# Patient Record
Sex: Female | Born: 1950 | Race: Black or African American | Hispanic: No | Smoking: Never smoker
Health system: Southern US, Community
[De-identification: ages and names within clinical notes are randomized; demographics above are authoritative.]

## PROBLEM LIST (undated history)

## (undated) DIAGNOSIS — R0602 Shortness of breath: Secondary | ICD-10-CM

## (undated) DIAGNOSIS — Z9889 Other specified postprocedural states: Secondary | ICD-10-CM

## (undated) DIAGNOSIS — E119 Type 2 diabetes mellitus without complications: Secondary | ICD-10-CM

## (undated) DIAGNOSIS — I10 Essential (primary) hypertension: Secondary | ICD-10-CM

## (undated) DIAGNOSIS — R112 Nausea with vomiting, unspecified: Secondary | ICD-10-CM

## (undated) DIAGNOSIS — E0590 Thyrotoxicosis, unspecified without thyrotoxic crisis or storm: Secondary | ICD-10-CM

## (undated) HISTORY — PX: EYE SURGERY: SHX253

---

## 2000-10-05 ENCOUNTER — Other Ambulatory Visit: Admission: RE | Admit: 2000-10-05 | Discharge: 2000-10-05 | Payer: Self-pay | Admitting: Family Medicine

## 2000-11-21 ENCOUNTER — Emergency Department (HOSPITAL_COMMUNITY): Admission: EM | Admit: 2000-11-21 | Discharge: 2000-11-21 | Payer: Self-pay | Admitting: Emergency Medicine

## 2002-03-25 ENCOUNTER — Emergency Department (HOSPITAL_COMMUNITY): Admission: EM | Admit: 2002-03-25 | Discharge: 2002-03-25 | Payer: Self-pay | Admitting: *Deleted

## 2002-03-25 ENCOUNTER — Encounter: Payer: Self-pay | Admitting: *Deleted

## 2002-03-28 ENCOUNTER — Emergency Department (HOSPITAL_COMMUNITY): Admission: EM | Admit: 2002-03-28 | Discharge: 2002-03-28 | Payer: Self-pay | Admitting: Emergency Medicine

## 2003-11-28 ENCOUNTER — Emergency Department (HOSPITAL_COMMUNITY): Admission: EM | Admit: 2003-11-28 | Discharge: 2003-11-29 | Payer: Self-pay | Admitting: *Deleted

## 2003-12-07 ENCOUNTER — Ambulatory Visit (HOSPITAL_COMMUNITY): Admission: RE | Admit: 2003-12-07 | Discharge: 2003-12-07 | Payer: Self-pay | Admitting: *Deleted

## 2004-05-08 ENCOUNTER — Emergency Department (HOSPITAL_COMMUNITY): Admission: EM | Admit: 2004-05-08 | Discharge: 2004-05-08 | Payer: Self-pay | Admitting: *Deleted

## 2006-03-08 IMAGING — CT CT CHEST W/ CM
1 of 2 series · 14 of 30 positions shown, 18 images · IV contrast (CONTRAST)
Comparison: none

CLINICAL DATA: Bronchitis; cough. Follow-up from chest x-ray of 11/29/03 which showed a vague
density in the right lower lobe.

[Series 7694: — · axial · 0.59mm/px · z∈[+1550,+1820]mm · 14 of 64 slices shown, 18 images]
[im 5/64  mediastinal]
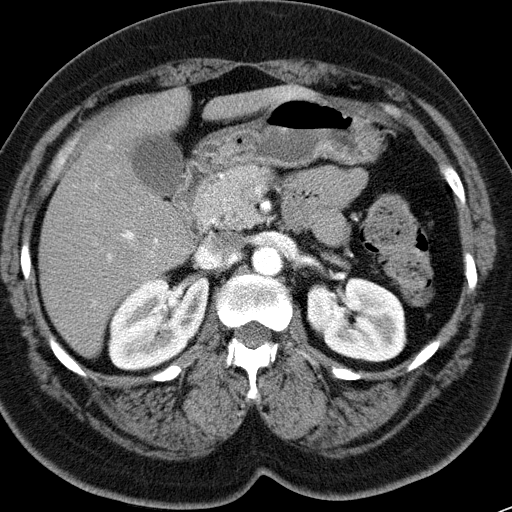
[im 5/64  lung]
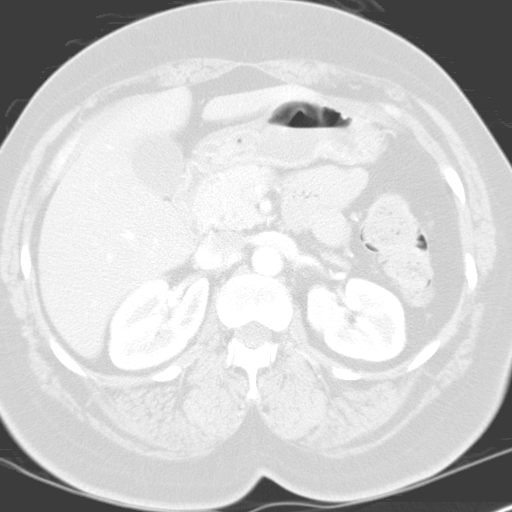
[im 10/64  lung]
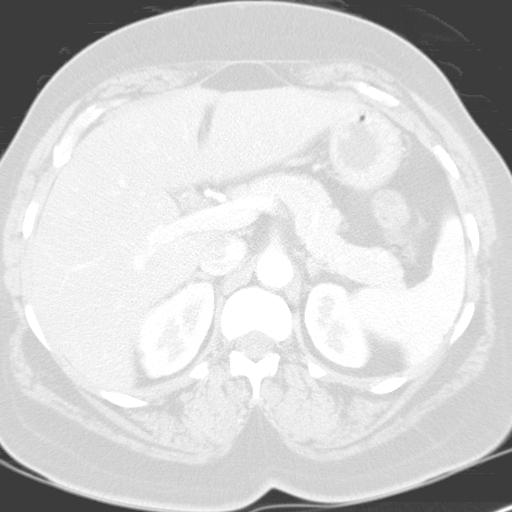
[im 14/64  lung]
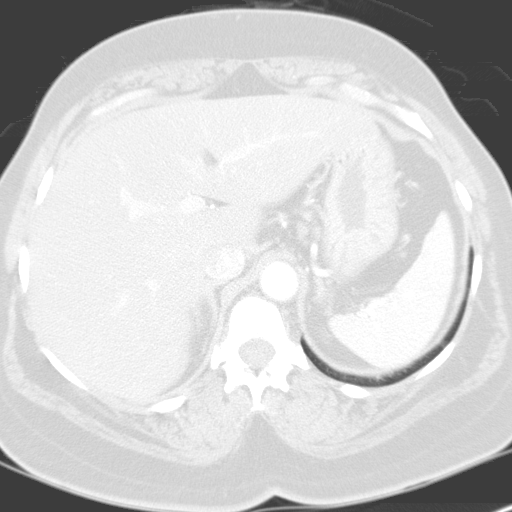
[im 19/64  lung]
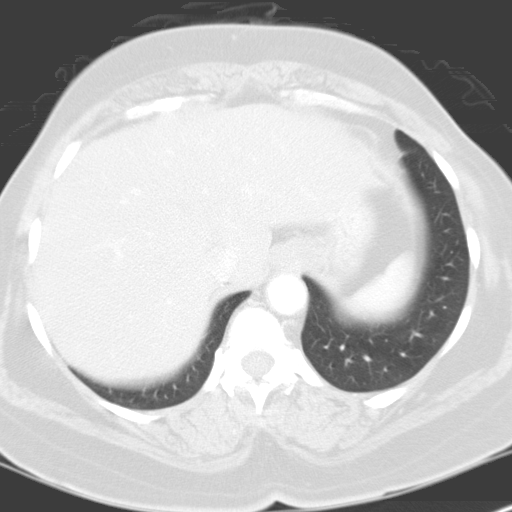
[im 23/64  mediastinal]
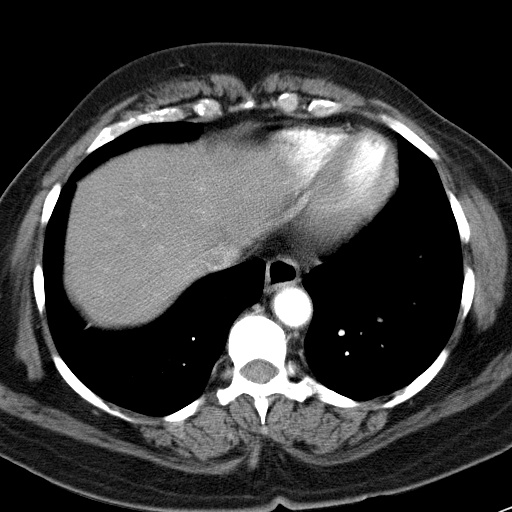
[im 23/64  lung]
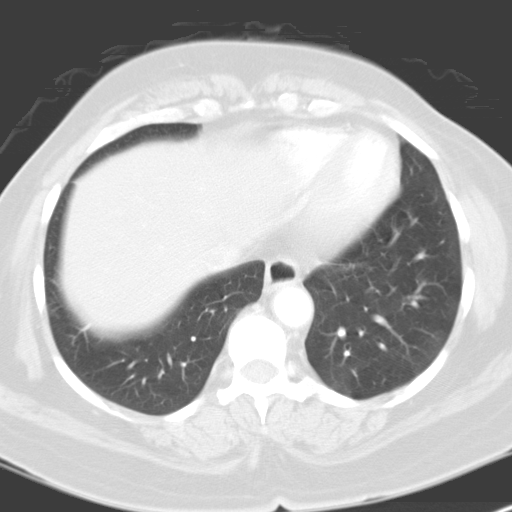
[im 28/64  lung]
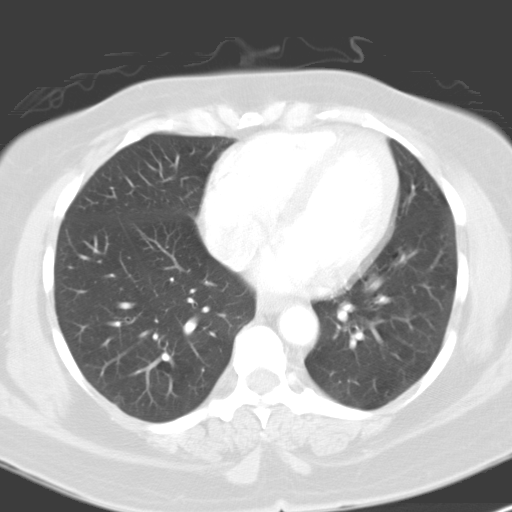
[im 31/64  lung]
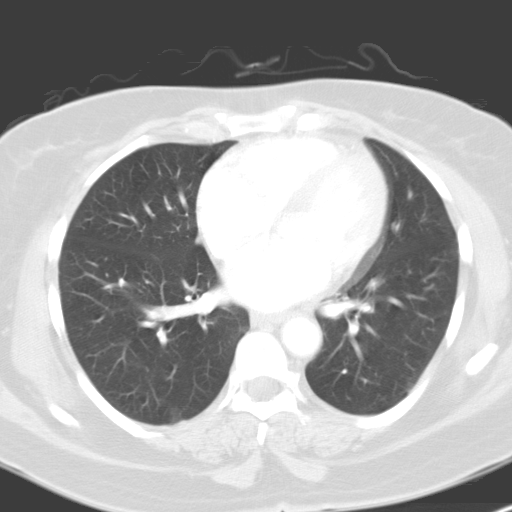
[im 32/64  lung]
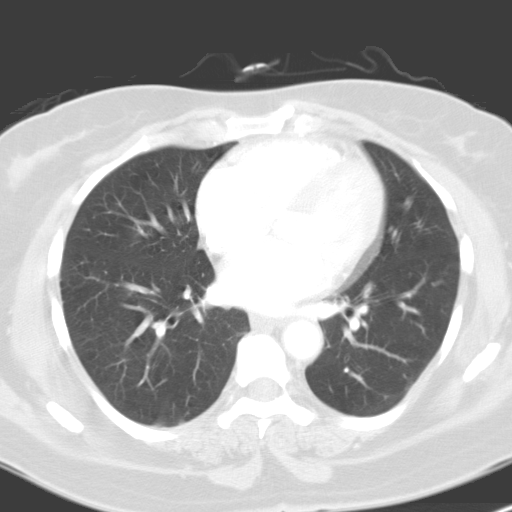
[im 37/64  mediastinal]
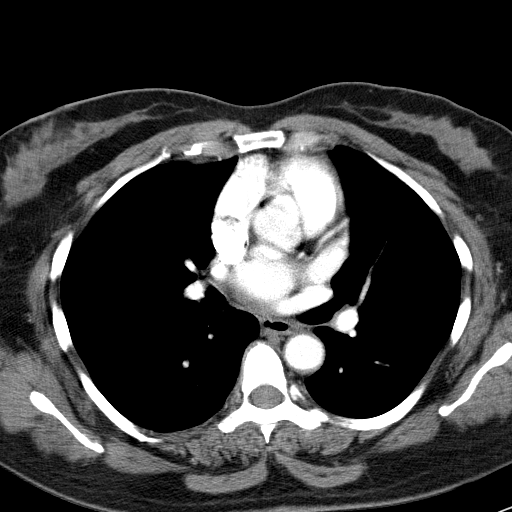
[im 37/64  lung]
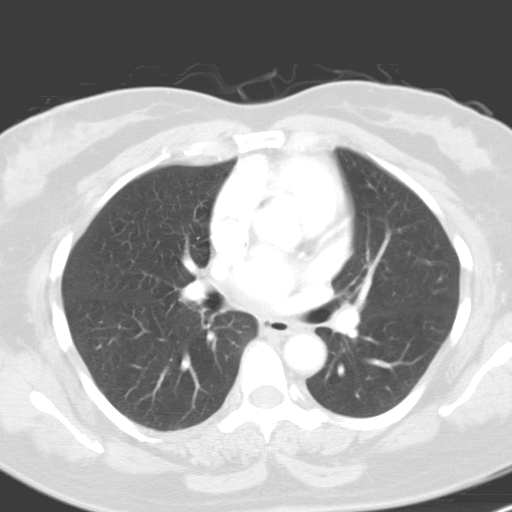
[im 41/64  lung]
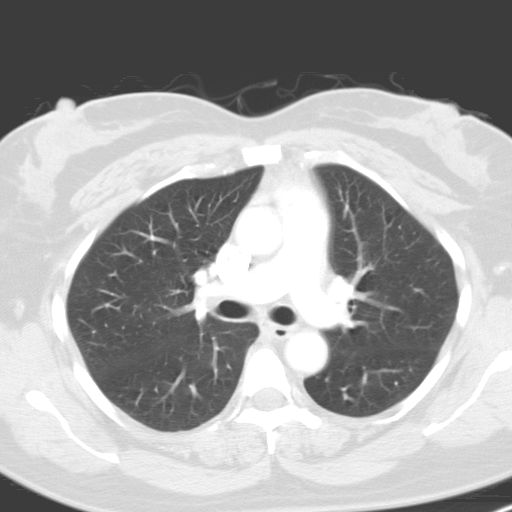
[im 46/64  lung]
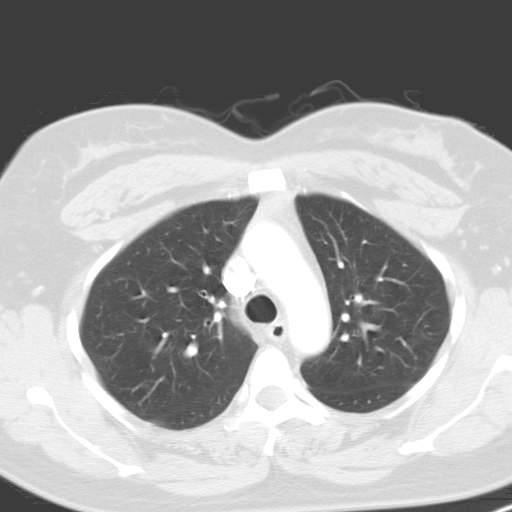
[im 50/64  lung]
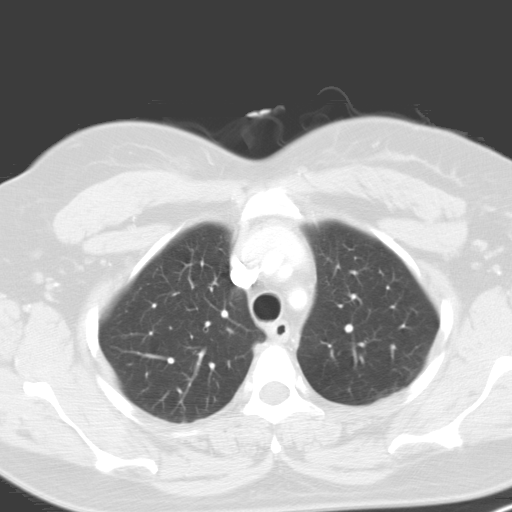
[im 55/64  mediastinal]
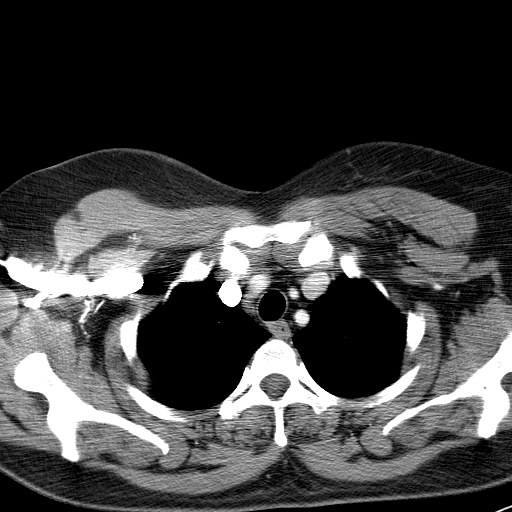
[im 55/64  lung]
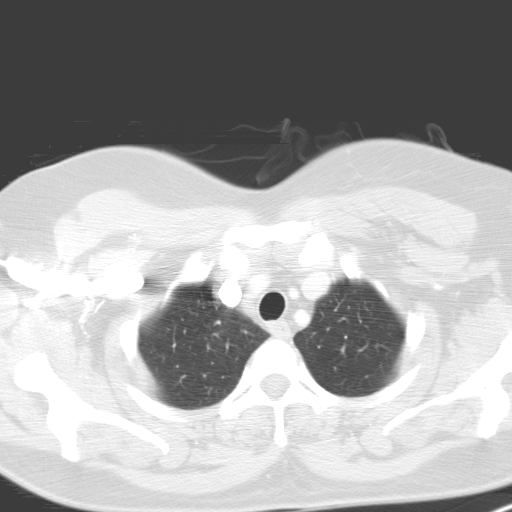
[im 59/64  lung]
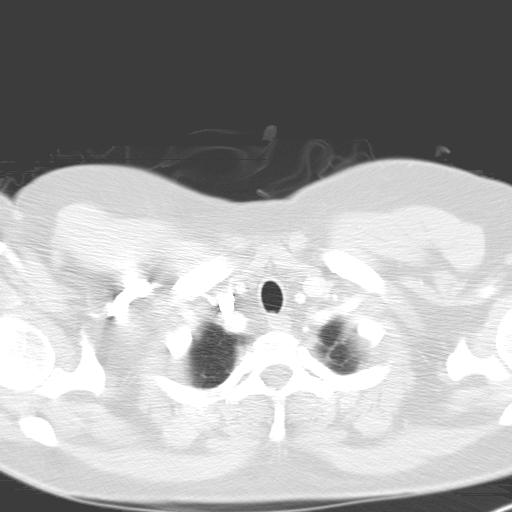

[14 of 30 positions shown; findings below may reference images not displayed]

CT SCAN OF THE CHEST WITH CONTRAST

After the intravenous injection of 100 ml of [id number], a series of scans of the entire chest
were made and are compared to the previous chest x-ray and show mild generalized peribronchial
thickening.  The area noted on the chest x-ray shows no evidence of mass or abnormality on the CT
scan. There is however a somewhat thickened bronchus associated with the anterior aspect of the
right upper lobe best seen on image #21 which could be an area of mild bronchiectasis of the
cylindrical type. No mass or obstruction is seen within the bronchial tree or the trachea.  There
is no consolidation, pleural effusion, or pneumothorax.  The heart and mediastinum are normal.

Bony thorax is normal.  No abnormal lymph nodes are identified.  There may be a small hiatal
hernia. The portion of the liver that is seen appears normal.  The adrenal glands appear normal.

IMPRESSION 

Generalized peribronchial thickening consistent with bronchitis.

Area of very focal questionable early cylindrical bronchiectasis right upper lobe. 

No evidence of neoplasm is seen.

## 2010-08-05 ENCOUNTER — Other Ambulatory Visit (HOSPITAL_COMMUNITY): Payer: Self-pay | Admitting: Family Medicine

## 2010-08-05 DIAGNOSIS — Z139 Encounter for screening, unspecified: Secondary | ICD-10-CM

## 2010-08-14 ENCOUNTER — Ambulatory Visit (HOSPITAL_COMMUNITY): Payer: Self-pay

## 2010-10-20 ENCOUNTER — Other Ambulatory Visit (HOSPITAL_COMMUNITY)
Admission: RE | Admit: 2010-10-20 | Discharge: 2010-10-20 | Disposition: A | Discharge status: Home / Self Care | Payer: PRIVATE HEALTH INSURANCE | Source: Ambulatory Visit | Attending: Obstetrics and Gynecology | Admitting: Obstetrics and Gynecology

## 2010-10-20 DIAGNOSIS — Z01419 Encounter for gynecological examination (general) (routine) without abnormal findings: Secondary | ICD-10-CM | POA: Insufficient documentation

## 2010-10-20 DIAGNOSIS — Z113 Encounter for screening for infections with a predominantly sexual mode of transmission: Secondary | ICD-10-CM | POA: Insufficient documentation

## 2010-11-04 ENCOUNTER — Other Ambulatory Visit: Payer: Self-pay

## 2010-11-04 ENCOUNTER — Encounter (HOSPITAL_COMMUNITY)
Admission: RE | Admit: 2010-11-04 | Discharge: 2010-11-04 | Disposition: A | Discharge status: Home / Self Care | Payer: PRIVATE HEALTH INSURANCE | Source: Ambulatory Visit | Attending: Obstetrics and Gynecology | Admitting: Obstetrics and Gynecology

## 2010-11-04 ENCOUNTER — Encounter (HOSPITAL_COMMUNITY): Payer: Self-pay

## 2010-11-04 HISTORY — DX: Other specified postprocedural states: Z98.890

## 2010-11-04 HISTORY — DX: Thyrotoxicosis, unspecified without thyrotoxic crisis or storm: E05.90

## 2010-11-04 HISTORY — DX: Other specified postprocedural states: R11.2

## 2010-11-04 HISTORY — DX: Essential (primary) hypertension: I10

## 2010-11-04 HISTORY — DX: Shortness of breath: R06.02

## 2010-11-04 LAB — URINALYSIS, ROUTINE W REFLEX MICROSCOPIC
Bilirubin Urine: NEGATIVE
Glucose, UA: NEGATIVE mg/dL
Hgb urine dipstick: NEGATIVE
Ketones, ur: NEGATIVE mg/dL
Leukocytes, UA: NEGATIVE
Nitrite: NEGATIVE
Protein, ur: 30 mg/dL — AB
Urobilinogen, UA: 0.2 mg/dL (ref 0.0–1.0)
pH: 6 (ref 5.0–8.0)

## 2010-11-04 LAB — BASIC METABOLIC PANEL
CO2: 29 mEq/L (ref 19–32)
Calcium: 10.1 mg/dL (ref 8.4–10.5)
Chloride: 104 mEq/L (ref 96–112)
Creatinine, Ser: 0.81 mg/dL (ref 0.50–1.10)
GFR calc Af Amer: >60 mL/min (ref >60)
Glucose, Bld: 147 mg/dL — ABNORMAL HIGH (ref 70–99)
Potassium: 4.3 mEq/L (ref 3.5–5.1)
Sodium: 139 mEq/L (ref 135–145)

## 2010-11-04 LAB — URINE MICROSCOPIC-ADD ON

## 2010-11-04 LAB — CBC
HCT: 35.3 % — ABNORMAL LOW (ref 36.0–46.0)
Hemoglobin: 11.6 g/dL — ABNORMAL LOW (ref 12.0–15.0)
MCH: 28 pg (ref 26.0–34.0)
MCHC: 32.9 g/dL (ref 30.0–36.0)
MCV: 85.1 fL (ref 78.0–100.0)
Platelets: 305 10*3/uL (ref 150–400)
RBC: 4.15 MIL/uL (ref 3.87–5.11)
RDW: 14.1 % (ref 11.5–15.5)
WBC: 7 10*3/uL (ref 4.0–10.5)

## 2010-11-04 NOTE — Patient Instructions (Addendum)
22 Leslie Lindsey  11/04/2010   Your procedure is scheduled on:  11/11/2010  Report to [facility] at  830  AM.  Call this number if you have problems the morning of surgery: [phone]   Remember:   Do not eat food:After Midnight.  Do not drink clear liquids: After Midnight.  Take these medicines the morning of surgery with A SIP OF WATER: Vasotec, hctz,synthroid,metoprolol   Do not wear jewelry, make-up or nail polish.  Do not wear lotions, powders, or perfumes. You may wear deodorant.  Do not shave 48 hours prior to surgery.  Do not bring valuables to the hospital.  Contacts, dentures or bridgework may not be worn into surgery.  Leave suitcase in the car. After surgery it may be brought to your room.  For patients admitted to the hospital, checkout time is 11:00 AM the day of discharge.   Patients discharged the day of surgery will not be allowed to drive home.  Name and phone number of your driver: family  Special Instructions: CHG Shower Use Special Wash: 1/2 bottle night before surgery and 1/2 bottle morning of surgery.   Please read over the following fact sheets that you were given: Pain Booklet, MRSA Information, Surgical Site Infection Prevention, Anesthesia Post-op Instructions and Care and Recovery After Surgery PATIENT INSTRUCTIONS

## 2010-11-10 ENCOUNTER — Other Ambulatory Visit: Payer: Self-pay | Admitting: Obstetrics and Gynecology

## 2010-11-10 NOTE — H&P (Signed)
Leslie Lindsey is an 60 y.o. female who is admitted thru day surgery for Hysteroscopy, dilation and curettage, with probable residual endometrial polyp. She had removal of a prolapsed endometrial polyp in the office setting, after referral by Dr Steven Knowlton, but subsequent ultrasound shows residual endometrial thickening. Portion of polyp removed showed simply hyperplasia without atypia.  Residual polyp is suspected. Pertinent Gynecological History: Menses: post-menopausal Bleeding: post menopausal bleeding Contraception: post menopausal status DES exposure: denies Blood transfusions: none Sexually transmitted diseases: no past history Previous GYN Procedures: office polyp removal  Last mammogram: managed by dr Knowlton Date:  Last pap: normal Date: Dr Knowlton OB History: G3, P2012   Menstrual History: Menarche age:  No LMP recorded. Patient is postmenopausal.    Past Medical History  Diagnosis Date  . PONV (postoperative nausea and vomiting)   . Hyperthyroidism   . Hypertension   . Shortness of breath   . Asthma     Past Surgical History  Procedure Date  . Eye surgery     30 years ago    Family History  Problem Relation Age of Onset  . Anesthesia problems Neg Hx   . Hypotension Neg Hx   . Malignant hyperthermia Neg Hx   . Pseudochol deficiency Neg Hx     Social History:  reports that she has never smoked. She does not have any smokeless tobacco history on file. She reports that she does not drink alcohol or use illicit drugs.  Allergies:  Allergies  Allergen Reactions  . Peanut-Containing Drug Products Anaphylaxis     (Not in a hospital admission)  ROSReview of Systems - Negative except peanut allergy   There were no vitals taken for this visit. Physical ExamPhysical Examination: General appearance - alert, well appearing, and in no distress and oriented to person, place, and time Mental status - alert, oriented to person, place, and time Neck -  supple, no significant adenopathy Chest - clear to auscultation, no wheezes, rales or rhonchi, symmetric air entry Heart - normal rate and regular rhythm Abdomen - soft, nontender, nondistended, no masses or organomegaly Pelvic - VULVA: normal appearing vulva with no masses, tenderness or lesions, VAGINA: normal appearing vagina with normal color and discharge, no lesions, atrophic, CERVIX: normal appearing cervix without discharge or lesions, endocervical polyp size 1.5  Cm was removed in office, UTERUS: uterus is normal size, shape, consistency and nontender, ultrasound shows a thickened endometrium 1.2 cm diameter, with a cystic area in the endometrial thickening, ADNEXA: normal adnexa in size, nontender and no masses Extremities - peripheral pulses normal, no pedal edema, no clubbing or cyanosis, Homan's sign negative bilaterally CBC    Component Value Date/Time   WBC 7.0 11/04/2010 0900   RBC 4.15 11/04/2010 0900   HGB 11.6* 11/04/2010 0900   HCT 35.3* 11/04/2010 0900   PLT 305 11/04/2010 0900   MCV 85.1 11/04/2010 0900   MCH 28.0 11/04/2010 0900   MCHC 32.9 11/04/2010 0900   RDW 14.1 11/04/2010 0900       Assessment/Plan: Postmenopausal Bleeding, endometrial polyp, for hysteroscopy, dilation and curettage, with removal of polyp remnant.   Zerek Litsey V 11/10/2010, 9:44 PM   

## 2010-11-11 ENCOUNTER — Encounter (HOSPITAL_COMMUNITY): Payer: Self-pay

## 2010-11-11 ENCOUNTER — Ambulatory Visit (HOSPITAL_COMMUNITY)
Admission: RE | Admit: 2010-11-11 | Discharge: 2010-11-11 | Disposition: A | Discharge status: Home / Self Care | Payer: PRIVATE HEALTH INSURANCE | Source: Ambulatory Visit | Attending: Obstetrics and Gynecology | Admitting: Obstetrics and Gynecology

## 2010-11-11 ENCOUNTER — Ambulatory Visit (HOSPITAL_COMMUNITY): Payer: PRIVATE HEALTH INSURANCE | Admitting: Anesthesiology

## 2010-11-11 ENCOUNTER — Encounter (HOSPITAL_COMMUNITY): Payer: Self-pay | Admitting: Anesthesiology

## 2010-11-11 ENCOUNTER — Encounter (HOSPITAL_COMMUNITY)
Admission: RE | Disposition: A | Discharge status: Home / Self Care | Payer: Self-pay | Source: Ambulatory Visit | Attending: Obstetrics and Gynecology

## 2010-11-11 ENCOUNTER — Other Ambulatory Visit: Payer: Self-pay | Admitting: Obstetrics and Gynecology

## 2010-11-11 DIAGNOSIS — Z01812 Encounter for preprocedural laboratory examination: Secondary | ICD-10-CM | POA: Insufficient documentation

## 2010-11-11 DIAGNOSIS — I10 Essential (primary) hypertension: Secondary | ICD-10-CM | POA: Insufficient documentation

## 2010-11-11 DIAGNOSIS — N950 Postmenopausal bleeding: Secondary | ICD-10-CM | POA: Insufficient documentation

## 2010-11-11 DIAGNOSIS — N840 Polyp of corpus uteri: Secondary | ICD-10-CM | POA: Insufficient documentation

## 2010-11-11 DIAGNOSIS — Z01810 Encounter for preprocedural cardiovascular examination: Secondary | ICD-10-CM | POA: Insufficient documentation

## 2010-11-11 DIAGNOSIS — Z79899 Other long term (current) drug therapy: Secondary | ICD-10-CM | POA: Insufficient documentation

## 2010-11-11 HISTORY — PX: HYSTEROSCOPY WITH D & C: SHX1775

## 2010-11-11 SURGERY — DILATATION AND CURETTAGE /HYSTEROSCOPY
Anesthesia: General | Wound class: Clean Contaminated

## 2010-11-11 MED ORDER — GLYCOPYRROLATE 0.2 MG/ML IJ SOLN
0.2000 mg | Freq: Once | INTRAMUSCULAR | Status: AC
Start: 2010-11-11 — End: 2010-11-11
  Administered 2010-11-11: 0.2 mg via INTRAVENOUS

## 2010-11-11 MED ORDER — GLYCOPYRROLATE 0.2 MG/ML IJ SOLN
INTRAMUSCULAR | Status: AC
  Administered 2010-11-11: 0.2 mg via INTRAVENOUS
  Filled 2010-11-11: qty 1

## 2010-11-11 MED ORDER — FENTANYL CITRATE 0.05 MG/ML IJ SOLN
INTRAMUSCULAR | Status: AC
  Administered 2010-11-11: 50 ug via INTRAVENOUS
  Filled 2010-11-11: qty 2

## 2010-11-11 MED ORDER — FENTANYL CITRATE 0.05 MG/ML IJ SOLN
INTRAMUSCULAR | Status: AC
  Administered 2010-11-11: 50 ug via INTRAVENOUS
  Filled 2010-11-11: qty 4

## 2010-11-11 MED ORDER — DEXAMETHASONE SODIUM PHOSPHATE 4 MG/ML IJ SOLN
4.0000 mg | Freq: Once | INTRAMUSCULAR | Status: AC
  Administered 2010-11-11: 4 mg via INTRAVENOUS

## 2010-11-11 MED ORDER — MIDAZOLAM HCL 2 MG/2ML IJ SOLN
1.0000 mg | INTRAMUSCULAR | Status: DC | PRN
  Administered 2010-11-11 (×2): 2 mg via INTRAVENOUS

## 2010-11-11 MED ORDER — DEXAMETHASONE SODIUM PHOSPHATE 4 MG/ML IJ SOLN
INTRAMUSCULAR | Status: AC
  Administered 2010-11-11: 4 mg via INTRAVENOUS
  Filled 2010-11-11: qty 1

## 2010-11-11 MED ORDER — LACTATED RINGERS IV SOLN
INTRAVENOUS | Status: DC
  Administered 2010-11-11: 10:00:00 via INTRAVENOUS

## 2010-11-11 MED ORDER — MIDAZOLAM HCL 2 MG/2ML IJ SOLN
INTRAMUSCULAR | Status: AC
  Administered 2010-11-11: 2 mg via INTRAVENOUS
  Filled 2010-11-11: qty 2

## 2010-11-11 MED ORDER — PROPOFOL 10 MG/ML IV EMUL
INTRAVENOUS | Status: AC
  Filled 2010-11-11: qty 20

## 2010-11-11 MED ORDER — ONDANSETRON HCL 4 MG/2ML IJ SOLN
INTRAMUSCULAR | Status: AC
  Administered 2010-11-11: 4 mg via INTRAVENOUS
  Filled 2010-11-11: qty 2

## 2010-11-11 MED ORDER — ONDANSETRON HCL 4 MG/2ML IJ SOLN: 4.0000 mg | Freq: Once | INTRAMUSCULAR | Status: DC | PRN

## 2010-11-11 MED ORDER — LIDOCAINE HCL 1 % IJ SOLN
INTRAMUSCULAR | Status: DC | PRN
  Administered 2010-11-11: 50 mg via INTRADERMAL

## 2010-11-11 MED ORDER — LACTATED RINGERS IV SOLN
INTRAVENOUS | Status: DC | PRN
  Administered 2010-11-11: 10:00:00 via INTRAVENOUS

## 2010-11-11 MED ORDER — KETOROLAC TROMETHAMINE 30 MG/ML IJ SOLN: 30.0000 mg | Freq: Once | INTRAMUSCULAR | Status: DC

## 2010-11-11 MED ORDER — SCOPOLAMINE 1 MG/3DAYS TD PT72
MEDICATED_PATCH | TRANSDERMAL | Status: AC
  Administered 2010-11-11: 1.5 mg via TRANSDERMAL
  Filled 2010-11-11: qty 1

## 2010-11-11 MED ORDER — LIDOCAINE HCL (PF) 1 % IJ SOLN
INTRAMUSCULAR | Status: AC
  Filled 2010-11-11: qty 5

## 2010-11-11 MED ORDER — MIDAZOLAM HCL 5 MG/5ML IJ SOLN
INTRAMUSCULAR | Status: DC | PRN
  Administered 2010-11-11: 2 mg via INTRAVENOUS

## 2010-11-11 MED ORDER — LACTATED RINGERS IV SOLN: INTRAVENOUS | Status: DC

## 2010-11-11 MED ORDER — ONDANSETRON HCL 4 MG/2ML IJ SOLN
4.0000 mg | Freq: Once | INTRAMUSCULAR | Status: AC
  Administered 2010-11-11: 4 mg via INTRAVENOUS

## 2010-11-11 MED ORDER — FENTANYL CITRATE 0.05 MG/ML IJ SOLN
25.0000 ug | INTRAMUSCULAR | Status: DC | PRN
  Administered 2010-11-11 (×2): 50 ug via INTRAVENOUS

## 2010-11-11 MED ORDER — PROPOFOL 10 MG/ML IV EMUL
INTRAVENOUS | Status: DC | PRN
  Administered 2010-11-11: 130 mg via INTRAVENOUS

## 2010-11-11 MED ORDER — FENTANYL CITRATE 0.05 MG/ML IJ SOLN
INTRAMUSCULAR | Status: DC | PRN
  Administered 2010-11-11 (×2): 50 ug via INTRAVENOUS

## 2010-11-11 MED ORDER — SCOPOLAMINE 1 MG/3DAYS TD PT72
1.0000 | MEDICATED_PATCH | Freq: Once | TRANSDERMAL | Status: DC
  Administered 2010-11-11: 1.5 mg via TRANSDERMAL

## 2010-11-11 MED ORDER — SODIUM CHLORIDE 0.9 % IR SOLN
Status: DC | PRN
  Administered 2010-11-11: 3000 mL

## 2010-11-11 MED ORDER — BUPIVACAINE-EPINEPHRINE PF 0.5-1:200000 % IJ SOLN
INTRAMUSCULAR | Status: AC
  Filled 2010-11-11: qty 10

## 2010-11-11 MED ORDER — MIDAZOLAM HCL 2 MG/2ML IJ SOLN
INTRAMUSCULAR | Status: AC
  Filled 2010-11-11: qty 2

## 2010-11-11 SURGICAL SUPPLY — 20 items
BAG HAMPER (MISCELLANEOUS) ×2 IMPLANT
CLOTH BEACON ORANGE TIMEOUT ST (SAFETY) ×2 IMPLANT
COVER LIGHT HANDLE STERIS (MISCELLANEOUS) ×4 IMPLANT
DECANTER SPIKE VIAL GLASS SM (MISCELLANEOUS) ×2 IMPLANT
FORMALIN 10 PREFIL 120ML (MISCELLANEOUS) ×2 IMPLANT
GLOVE ECLIPSE 9.0 STRL (GLOVE) ×2 IMPLANT
GLOVE INDICATOR STER SZ 9 (GLOVE) ×2 IMPLANT
GOWN BRE IMP SLV AUR XL STRL (GOWN DISPOSABLE) ×2 IMPLANT
GOWN STRL REIN 3XL LVL4 (GOWN DISPOSABLE) ×2 IMPLANT
INST SET HYSTEROSCOPY (KITS) ×2 IMPLANT
IV NS IRRIG 3000ML ARTHROMATIC (IV SOLUTION) ×2 IMPLANT
KIT ROOM TURNOVER APOR (KITS) ×2 IMPLANT
MANIFOLD NEPTUNE II (INSTRUMENTS) ×2 IMPLANT
NS IRRIG 1000ML POUR BTL (IV SOLUTION) ×2 IMPLANT
PACK PERI GYN (CUSTOM PROCEDURE TRAY) ×2 IMPLANT
PAD ARMBOARD 7.5X6 YLW CONV (MISCELLANEOUS) ×2 IMPLANT
PAD TELFA 3X4 1S STER (GAUZE/BANDAGES/DRESSINGS) ×2 IMPLANT
SET BASIN LINEN APH (SET/KITS/TRAYS/PACK) ×2 IMPLANT
SET CYSTO W/LG BORE CLAMP LF (SET/KITS/TRAYS/PACK) ×2 IMPLANT
SYR CONTROL 10ML LL (SYRINGE) ×1 IMPLANT

## 2010-11-11 NOTE — Anesthesia Postprocedure Evaluation (Signed)
  Anesthesia Post-op Note  Patient: Leslie Lindsey  Procedure(s) Performed:  DILATATION AND CURETTAGE (D&C) /HYSTEROSCOPY - WITH REMOVAL ENDOMETRIAL POLYP  Patient Location: PACU  Anesthesia Type: General  Level of Consciousness: awake and patient cooperative  Airway and Oxygen Therapy: Patient Spontanous Breathing and Patient connected to face mask oxygen  Post-op Pain: none  Post-op Assessment: Post-op Vital signs reviewed, Patient's Cardiovascular Status Stable, Respiratory Function Stable, Patent Airway and No signs of Nausea or vomiting  Post-op Vital Signs: Reviewed and stable  Complications: No apparent anesthesia complications

## 2010-11-11 NOTE — H&P (View-Only) (Signed)
Leslie Lindsey is an 60 y.o. female who is admitted thru day surgery for Hysteroscopy, dilation and curettage, with probable residual endometrial polyp. She had removal of a prolapsed endometrial polyp in the office setting, after referral by Dr Katharine Look, but subsequent ultrasound shows residual endometrial thickening. Portion of polyp removed showed simply hyperplasia without atypia.  Residual polyp is suspected. Pertinent Gynecological History: Menses: post-menopausal Bleeding: post menopausal bleeding Contraception: post menopausal status DES exposure: denies Blood transfusions: none Sexually transmitted diseases: no past history Previous GYN Procedures: office polyp removal  Last mammogram: managed by dr Sudie Bailey Date:  Last pap: normal Date: Dr Elise Benne History: G3, P2012   Menstrual History: Menarche age:  No LMP recorded. Patient is postmenopausal.    Past Medical History  Diagnosis Date  . PONV (postoperative nausea and vomiting)   . Hyperthyroidism   . Hypertension   . Shortness of breath   . Asthma     Past Surgical History  Procedure Date  . Eye surgery     30 years ago    Family History  Problem Relation Age of Onset  . Anesthesia problems Neg Hx   . Hypotension Neg Hx   . Malignant hyperthermia Neg Hx   . Pseudochol deficiency Neg Hx     Social History:  reports that she has never smoked. She does not have any smokeless tobacco history on file. She reports that she does not drink alcohol or use illicit drugs.  Allergies:  Allergies  Allergen Reactions  . Peanut-Containing Drug Products Anaphylaxis     (Not in a hospital admission)  ROSReview of Systems - Negative except peanut allergy   There were no vitals taken for this visit. Physical ExamPhysical Examination: General appearance - alert, well appearing, and in no distress and oriented to person, place, and time Mental status - alert, oriented to person, place, and time Neck -  supple, no significant adenopathy Chest - clear to auscultation, no wheezes, rales or rhonchi, symmetric air entry Heart - normal rate and regular rhythm Abdomen - soft, nontender, nondistended, no masses or organomegaly Pelvic - VULVA: normal appearing vulva with no masses, tenderness or lesions, VAGINA: normal appearing vagina with normal color and discharge, no lesions, atrophic, CERVIX: normal appearing cervix without discharge or lesions, endocervical polyp size 1.5  Cm was removed in office, UTERUS: uterus is normal size, shape, consistency and nontender, ultrasound shows a thickened endometrium 1.2 cm diameter, with a cystic area in the endometrial thickening, ADNEXA: normal adnexa in size, nontender and no masses Extremities - peripheral pulses normal, no pedal edema, no clubbing or cyanosis, Homan's sign negative bilaterally CBC    Component Value Date/Time   WBC 7.0 11/04/2010 0900   RBC 4.15 11/04/2010 0900   HGB 11.6* 11/04/2010 0900   HCT 35.3* 11/04/2010 0900   PLT 305 11/04/2010 0900   MCV 85.1 11/04/2010 0900   MCH 28.0 11/04/2010 0900   MCHC 32.9 11/04/2010 0900   RDW 14.1 11/04/2010 0900       Assessment/Plan: Postmenopausal Bleeding, endometrial polyp, for hysteroscopy, dilation and curettage, with removal of polyp remnant.   Indigo Barbian V 11/10/2010, 9:44 PM

## 2010-11-11 NOTE — Interval H&P Note (Signed)
History and Physical Interval Note:   11/11/2010   10:51 AM   Leslie Lindsey  has presented today for surgery, with the diagnosis of ENDOMETRIAL POLYP, POSTMENOPAUSAL BLEEDING  The various methods of treatment have been discussed with the patient and family. After consideration of risks, benefits and other options for treatment, the patient has consented to  Procedure(s): DILATATION AND CURETTAGE (D&C) /HYSTEROSCOPY as a surgical intervention .  I have reviewed the patients' chart and labs.  Questions were answered to the patient's satisfaction.     Tilda Burrow  MD  Patient interviewed, no changes in patient status or condition since dictated history.  Patient confirms that she is Jehovah's witness and has signed decline of blood transfusion   Under all circumstances , including life threatening ;complications of case.

## 2010-11-11 NOTE — Transfer of Care (Signed)
Immediate Anesthesia Transfer of Care Note  Patient: Leslie Lindsey  Procedure(s) Performed:  DILATATION AND CURETTAGE (D&C) /HYSTEROSCOPY - WITH REMOVAL ENDOMETRIAL POLYP  Patient Location: PACU  Anesthesia Type: General  Level of Consciousness: awake and patient cooperative  Airway & Oxygen Therapy: Patient Spontanous Breathing and Patient connected to face mask oxygen  Post-op Assessment: Report given to PACU RN, Post -op Vital signs reviewed and stable and Patient moving all extremities  Post vital signs: Reviewed and stable  Complications: No apparent anesthesia complications

## 2010-11-11 NOTE — Anesthesia Preprocedure Evaluation (Addendum)
Anesthesia Evaluation  Name, MR# and DOB Patient awake  General Assessment Comment  Reviewed: Allergy & Precautions, H&P , NPO status , Patient's Chart, lab work & pertinent test results, reviewed documented beta blocker date and time   History of Anesthesia Complications (+) PONV  Airway Mallampati: III TM Distance: <3 FB Neck ROM: Full    Dental  (+) Teeth Intact   Pulmonary  asthma (well controlled, rarely uses inhaler.)    pulmonary exam normalPulmonary Exam Normal     Cardiovascular hypertension, Pt. on medications and Pt. on home beta blockers Regular Normal    Neuro/Psych   GI/Hepatic/Renal   Endo/Other  (+) Hypothyroidism,      Abdominal   Musculoskeletal   Hematology   Peds  Reproductive/Obstetrics    Anesthesia Other Findings             Anesthesia Physical Anesthesia Plan  ASA: II  Anesthesia Plan: General   Post-op Pain Management:    Induction: Intravenous  Airway Management Planned: LMA  Additional Equipment:   Intra-op Plan:   Post-operative Plan:   Informed Consent: I have reviewed the patients History and Physical, chart, labs and discussed the procedure including the risks, benefits and alternatives for the proposed anesthesia with the patient or authorized representative who has indicated his/her understanding and acceptance.     Plan Discussed with:   Anesthesia Plan Comments:         Anesthesia Quick Evaluation

## 2010-11-11 NOTE — Anesthesia Procedure Notes (Addendum)
Procedure Name: LMA Insertion Date/Time: 11/11/2010 11:11 AM Performed by: Despina Hidden Pre-anesthesia Checklist: Patient identified, Patient being monitored, Timeout performed, Emergency Drugs available and Suction available Patient Re-evaluated:Patient Re-evaluated prior to inductionOxygen Delivery Method: Circle System Utilized Preoxygenation: Pre-oxygenation with 100% oxygen Intubation Type: IV induction Ventilation: Mask ventilation without difficulty LMA Size: 3.0 Number of attempts: 1 Placement Confirmation: breath sounds checked- equal and bilateral and positive ETCO2 Tube secured with: Tape Dental Injury: Teeth and Oropharynx as per pre-operative assessment

## 2010-11-11 NOTE — Brief Op Note (Signed)
11/11/2010  11:57 AM  PATIENT:  Leslie Lindsey  60 y.o. female  PRE-OPERATIVE DIAGNOSIS:  ENDOMETRIAL POLYP, POSTMENOPAUSAL BLEEDING  POST-OPERATIVE DIAGNOSIS:  ENDOMETRIAL POLYP, POSTMENOPAUSAL BLEEDING  PROCEDURE:  Procedure(s): DILATATION AND CURETTAGE (D&C) /HYSTEROSCOPY  SURGEON:  Surgeon(s): Tilda Burrow, MD  PHYSICIAN ASSISTANT:   ASSISTANTS: none   ANESTHESIA:   general  OR FLUID I/O:  Total I/O In: 1000 [I.V.:1000] Out: -   BLOOD ADMINISTERED:none  DRAINS: none   LOCAL MEDICATIONS USED:  NONE  SPECIMEN:  Source of Specimen:  endometrium  DISPOSITION OF SPECIMEN:  PATHOLOGY  COUNTS:  YES  TOURNIQUET:  * No tourniquets in log *  DICTATION: .Dragon Dictation  PLAN OF CARE: Discharge to home after PACU  PATIENT DISPOSITION:  PACU - hemodynamically stable.   Delay start of Pharmacological VTE agent (>24hrs) due to surgical blood loss or risk of bleeding:  not applicable

## 2010-11-11 NOTE — Op Note (Signed)
10/24/2010  9:29 AM  PATIENT:  Leslie Lindsey  PRE-OPERATIVE DIAGNOSIS: Postmenopausal bleeding endometrial polyp  POST-OPERATIVE DIAGNOSIS:  Same  PROCEDURE:  Procedure(s): Hysteroscopy dilation and curettage removal of endometrial polyp  SURGEON:Samani Deal Benancio Deeds, MD  PHYSICIAN ASSISTANT: None  ADDITIONAL ASSISTANTS: none   ANESTHESIA:   general  ESTIMATED BLOOD LOSS: Minimal   BLOOD ADMINISTERED:none  DRAINS: none   LOCAL MEDICATIONS USED:  NONE  SPECIMEN:  Source of Specimen:  Endometrial curettings  DISPOSITION OF SPECIMEN:  PATHOLOGY  COUNTS:  YES  TOURNIQUET:  * No tourniquets in log *  DICTATION #: The patient was taken to the operating room prepped and draped for vaginal procedure. Timeout was conducted and procedure confirmed by operative team. Speculum was inserted, speculum grasped with a single-tooth tenaculum, and uterus sounded to 8 cm. Cervix was dilated to 25 [place] and the hysteroscope was introduced identifying an endometrial polyp . Curettage was attempted and small fragments of the polyp could be removed. Repeated inspections with the hysteroscope allowed gradual fragmentation of endometrial polyp. Multiple specimens were collected. Finally the hysteroscope revealed an empty endometrial cavity. There was no suspicion of complications or perforation. Sponge and needle counts correct patient to recovery room in stable condition  PLAN OF CARE: Homecare today  PATIENT DISPOSITION:  PACU - hemodynamically stable.   Delay start of Pharmacological VTE agent (>24hrs) due to surgical blood loss or risk of bleeding:  not applicable

## 2010-11-11 NOTE — Progress Notes (Signed)
No wound. Note that peripad was placed between legs

## 2010-11-14 ENCOUNTER — Encounter (HOSPITAL_COMMUNITY): Payer: Self-pay | Admitting: Obstetrics and Gynecology

## 2018-09-18 ENCOUNTER — Encounter (HOSPITAL_COMMUNITY): Payer: Self-pay

## 2018-09-18 ENCOUNTER — Emergency Department (HOSPITAL_COMMUNITY)
Admission: EM | Admit: 2018-09-18 | Discharge: 2018-09-18 | Disposition: A | Discharge status: Home / Self Care | Payer: Medicare Other | Attending: Emergency Medicine | Admitting: Emergency Medicine

## 2018-09-18 ENCOUNTER — Other Ambulatory Visit: Payer: Self-pay

## 2018-09-18 DIAGNOSIS — J45909 Unspecified asthma, uncomplicated: Secondary | ICD-10-CM | POA: Insufficient documentation

## 2018-09-18 DIAGNOSIS — E0590 Thyrotoxicosis, unspecified without thyrotoxic crisis or storm: Secondary | ICD-10-CM | POA: Diagnosis not present

## 2018-09-18 DIAGNOSIS — Z79899 Other long term (current) drug therapy: Secondary | ICD-10-CM | POA: Insufficient documentation

## 2018-09-18 DIAGNOSIS — I10 Essential (primary) hypertension: Secondary | ICD-10-CM | POA: Diagnosis not present

## 2018-09-18 DIAGNOSIS — J019 Acute sinusitis, unspecified: Secondary | ICD-10-CM

## 2018-09-18 DIAGNOSIS — H9203 Otalgia, bilateral: Secondary | ICD-10-CM | POA: Diagnosis present

## 2018-09-18 LAB — I-STAT CHEM 8, ED
BUN: 12 mg/dL (ref 8–23)
Calcium, Ion: 1.18 mmol/L (ref 1.15–1.40)
Chloride: 106 mmol/L (ref 98–111)
Creatinine, Ser: 0.7 mg/dL (ref 0.44–1.00)
Glucose, Bld: 128 mg/dL — ABNORMAL HIGH (ref 70–99)
HCT: 38 % (ref 36.0–46.0)
Hemoglobin: 12.9 g/dL (ref 12.0–15.0)
Potassium: 3.8 mmol/L (ref 3.5–5.1)
Sodium: 140 mmol/L (ref 135–145)
TCO2: 25 mmol/L (ref 22–32)

## 2018-09-18 MED ORDER — AMOXICILLIN-POT CLAVULANATE 875-125 MG PO TABS
1.0000 | ORAL_TABLET | Freq: Once | ORAL | Status: AC
  Administered 2018-09-18: 1 via ORAL
  Filled 2018-09-18: qty 1

## 2018-09-18 MED ORDER — LABETALOL HCL 5 MG/ML IV SOLN
10.0000 mg | Freq: Once | INTRAVENOUS | Status: AC
  Administered 2018-09-18: 20:00:00 10 mg via INTRAVENOUS
  Filled 2018-09-18: qty 4

## 2018-09-18 MED ORDER — METOPROLOL TARTRATE 25 MG PO TABS
25.0000 mg | ORAL_TABLET | Freq: Once | ORAL | Status: AC
  Administered 2018-09-18: 25 mg via ORAL
  Filled 2018-09-18: qty 1

## 2018-09-18 MED ORDER — AMOXICILLIN-POT CLAVULANATE 875-125 MG PO TABS: 1.0000 | ORAL_TABLET | Freq: Two times a day (BID) | ORAL | 0 refills | Status: DC

## 2018-09-18 NOTE — ED Triage Notes (Signed)
Pt presents to ED with complaints of bilateral ear ache starting a couple of days ago. Pt unknown fever, but states she has had chills.

## 2018-09-18 NOTE — ED Provider Notes (Signed)
[facility] EMERGENCY DEPARTMENT Provider Note   CSN: [id number] Arrival date & time: 09/18/18  1756     History   Chief Complaint Chief Complaint  Patient presents with  . Otalgia  . Hypertension    HPI Leslie Lindsey is a 68 y.o. female.     Patient with history of hypertension presents to the emergency department with complaint of 5 days of sinus pressure and congestion.  Patient has had associated chills and feeling hot.  Over the past 2 days she is developed bilateral ear pain, left greater than right.  No drainage from the ear.  She has been taking ibuprofen for pain.  She states that she takes 3 different blood pressure medications once a day.  She states that her blood pressures will generally run between 150 and 180 systolic at home.  Patient denies any chest pain, shortness of breath, vision changes, syncope, headache.  She states that she scheduled a doctor's appointment for 2 days however wanted to be seen sooner given her continued symptoms.  She states that she took 1 of her blood pressure medication just prior to arrival.     Past Medical History:  Diagnosis Date  . Asthma   . Hypertension   . Hyperthyroidism   . PONV (postoperative nausea and vomiting)   . Shortness of breath     Patient Active Problem List   Diagnosis Date Noted  . Endometrial polyp 11/11/2010    Class: Present on Admission    Past Surgical History:  Procedure Laterality Date  . EYE SURGERY     30 years ago  . HYSTEROSCOPY W/D&C  11/11/2010   Procedure: DILATATION AND CURETTAGE (D&C) /HYSTEROSCOPY;  Surgeon: Tilda BurrowJohn V Ferguson, MD;  Location: [facility];  Service: Gynecology;  Laterality: N/A;  WITH REMOVAL ENDOMETRIAL POLYP     OB History   No obstetric history on file.      Home Medications    Prior to Admission medications   Medication Sig Start Date End Date Taking? Authorizing Provider  acetaminophen (TYLENOL) 500 MG tablet Take 500 mg by mouth every 6 (six) hours as needed.  For pain    [provider]  calcium & magnesium carbonates (MYLANTA) 311-232 MG per tablet Take 1 tablet by mouth daily.     [provider]  Cod Liver Oil 1000 MG CAPS Take 1,000 mg by mouth daily.     [provider]  diphenhydrAMINE (SOMINEX) 25 MG tablet Take 25 mg by mouth at bedtime as needed. For allergies    [provider]  enalapril (VASOTEC) 20 MG tablet Take 20 mg by mouth daily.     [provider]  hydrochlorothiazide 50 MG tablet Take 25 mg by mouth daily.     [provider]  levothyroxine (SYNTHROID, LEVOTHROID) 100 MCG tablet Take 100 mcg by mouth daily.     [provider]  metoprolol (LOPRESSOR) 50 MG tablet Take 25 mg by mouth 2 (two) times daily.     [provider]    Family History Family History  Problem Relation Age of Onset  . Anesthesia problems Neg Hx   . Hypotension Neg Hx   . Malignant hyperthermia Neg Hx   . Pseudochol deficiency Neg Hx     Social History Social History   Tobacco Use  . Smoking status: Never Smoker  . Smokeless tobacco: Never Used  Substance Use Topics  . Alcohol use: No  . Drug use: No  Allergies   Peanut-containing drug products   Review of Systems Review of Systems  Constitutional: Positive for chills and fever (subjective).  HENT: Positive for congestion, ear pain, sinus pressure and sinus pain. Negative for ear discharge, rhinorrhea and sore throat.   Eyes: Negative for redness.  Respiratory: Negative for cough and shortness of breath.   Cardiovascular: Negative for chest pain.  Gastrointestinal: Negative for abdominal pain, diarrhea, nausea and vomiting.  Genitourinary: Negative for dysuria.  Musculoskeletal: Negative for myalgias.  Skin: Negative for rash.  Neurological: Negative for headaches.     Physical Exam Updated Vital Signs BP (!) 238/101 (BP Location: Right Arm)   Pulse 95   Temp 98.1 F (36.7 C) (Oral)   Resp 18   Ht 5'  5" (1.651 m)   Wt 95.3 kg   SpO2 98%   BMI 34.95 kg/m   Physical Exam Vitals signs and nursing note reviewed.  Constitutional:      Appearance: She is well-developed.  HENT:     Head: Normocephalic and atraumatic.     Jaw: No trismus.     Right Ear: Ear canal and external ear normal. A middle ear effusion is present. Tympanic membrane is bulging. Tympanic membrane is not erythematous.     Left Ear: Ear canal and external ear normal. A middle ear effusion is present. Tympanic membrane is bulging. Tympanic membrane is not erythematous.     Ears:     Comments: L>R middle ear effusion    Nose: No mucosal edema or rhinorrhea.     Right Sinus: Maxillary sinus tenderness and frontal sinus tenderness present.     Left Sinus: Maxillary sinus tenderness and frontal sinus tenderness present.     Mouth/Throat:     Mouth: Mucous membranes are not dry. No oral lesions.     Pharynx: Uvula midline. No oropharyngeal exudate, posterior oropharyngeal erythema or uvula swelling.     Tonsils: No tonsillar abscesses.  Eyes:     General:        Right eye: No discharge.        Left eye: No discharge.     Conjunctiva/sclera: Conjunctivae normal.  Neck:     Musculoskeletal: Normal range of motion and neck supple.  Cardiovascular:     Rate and Rhythm: Normal rate and regular rhythm.     Heart sounds: Normal heart sounds.  Pulmonary:     Effort: Pulmonary effort is normal. No respiratory distress.     Breath sounds: Normal breath sounds. No wheezing or rales.  Abdominal:     Palpations: Abdomen is soft.     Tenderness: There is no abdominal tenderness.  Lymphadenopathy:     Cervical: No cervical adenopathy.  Skin:    General: Skin is warm and dry.  Neurological:     Mental Status: She is alert.      ED Treatments / Results  Labs (all labs ordered are listed, but only abnormal results are displayed) Labs Reviewed  I-STAT CHEM 8, ED    EKG None  Radiology No results found.   Procedures Procedures (including critical care time)  Medications Ordered in ED Medications  labetalol (NORMODYNE) injection 10 mg (has no administration in time range)  amoxicillin-clavulanate (AUGMENTIN) 875-125 MG per tablet 1 tablet (1 tablet Oral Given 09/18/18 1950)  metoprolol tartrate (LOPRESSOR) tablet 25 mg (25 mg Oral Given 09/18/18 1950)     Initial Impression / Assessment and Plan / ED Course  I have reviewed the triage vital signs and  the nursing notes.  Pertinent labs & imaging results that were available during my care of the patient were reviewed by me and considered in my medical decision making (see chart for details).        Patient seen and examined. Medications ordered.   Vital signs reviewed and are as follows: BP (!) 238/101 (BP Location: Right Arm)   Pulse 95   Temp 98.1 F (36.7 C) (Oral)   Resp 18   Ht 5\' 5"  (1.651 m)   Wt 95.3 kg   SpO2 98%   BMI 34.95 kg/m   Patient clinically with sinusitis.  Only 4 to 5 days of symptoms however given questionable fevers, will cover with Augmentin.  Patient does not have any allergies to antibiotics.  Regarding her hypertension, will recheck blood pressure.  Will give additional dose of metoprolol 25 mg here.  Patient states that she only takes this once a day.  Strongly encouraged PCP follow-up for blood pressure recheck in 2 days.  Discussed that she needs to avoid NSAIDs and decongestants as these can raise her blood pressure.  She may try antihistamines or Flonase nasal spray if desired for sinus symptoms.  Recommend Tylenol for pain.  8:07 PM BP still very elevated. Discussed with Dr. Clarene DukeMcManus. Will check I-stat creatinine and give a dose of IV labetalol. Pt agreeable.   9:30 PM BP improving. Kidney function is normal. Pt ready for d/c.   Patient encouraged to follow-up with her doctor in 2 days for recheck of her blood pressure.  Confirmed pharmacy with patient.  Final Clinical Impressions(s) / ED  Diagnoses   Final diagnoses:  Acute non-recurrent sinusitis, unspecified location  Essential hypertension   Patient with symptoms of sinusitis.  Given reported fever and chills, patient will be treated with Augmentin.  She appears well, nontoxic.  Patient with elevated blood pressure in the emergency department without signs of endorgan damage.  She was treated with home medication and IV labetalol with some improvement.  No indications for admission tonight.  She has appropriate PCP follow-up in 2 days for recheck of her blood pressure.  She will continue taking her 3 home blood pressure medications as directed.  ED Discharge Orders         Ordered    amoxicillin-clavulanate (AUGMENTIN) 875-125 MG tablet  Every 12 hours     09/18/18 2130           Renne CriglerGeiple, Camera Krienke, PA-C 09/18/18 2154    Samuel JesterMcManus, Kathleen, DO 09/22/18 [phone]

## 2018-09-18 NOTE — Discharge Instructions (Signed)
Please follow-up with your doctor in 2 days for recheck of your blood pressure and sinusitis.  Please take your blood pressure medication as directed at home.  You may use over-the-counter Claritin or Flonase to see if this helps with your ear pain and congestion.

## 2019-05-13 ENCOUNTER — Ambulatory Visit: Payer: PRIVATE HEALTH INSURANCE | Attending: Internal Medicine

## 2019-05-13 DIAGNOSIS — Z23 Encounter for immunization: Secondary | ICD-10-CM

## 2019-05-13 NOTE — Progress Notes (Signed)
   Covid-19 Vaccination Clinic  Name:  Leslie Lindsey    MRN: [id number] DOB: 1950-12-15  05/13/2019  [name] was observed post Covid-19 immunization for 15 minutes without incident. She was provided with Vaccine Information Sheet and instruction to access the V-Safe system.   [name] was instructed to call 911 with any severe reactions post vaccine: Marland Kitchen Difficulty breathing  . Swelling of face and throat  . A fast heartbeat  . A bad rash all over body  . Dizziness and weakness   Immunizations Administered    Name Date Dose VIS Date Route   Moderna COVID-19 Vaccine 05/13/2019 11:54 AM 0.5 mL 01/31/2019 Intramuscular   Manufacturer: Moderna   Lot: [id number]   NDC: 61950-932-67

## 2019-06-14 ENCOUNTER — Ambulatory Visit: Payer: PRIVATE HEALTH INSURANCE | Attending: Internal Medicine

## 2019-06-14 DIAGNOSIS — Z23 Encounter for immunization: Secondary | ICD-10-CM

## 2019-06-14 NOTE — Progress Notes (Signed)
   Covid-19 Vaccination Clinic  Name:  [name]    MRN: [id number] DOB: 08/23/1950  06/14/2019  [name] was observed post Covid-19 immunization for 30 minutes based on pre-vaccination screening without incident. She was provided with Vaccine Information Sheet and instruction to access the V-Safe system.   [name] was instructed to call 911 with any severe reactions post vaccine: Marland Kitchen Difficulty breathing  . Swelling of face and throat  . A fast heartbeat  . A bad rash all over body  . Dizziness and weakness   Immunizations Administered    Name Date Dose VIS Date Route   Moderna COVID-19 Vaccine 06/14/2019 11:41 AM 0.5 mL 01/31/2019 Intramuscular   Manufacturer: Moderna   Lot: [id number]   NDC: 79728-206-01

## 2020-08-19 ENCOUNTER — Ambulatory Visit (HOSPITAL_COMMUNITY)
Admission: RE | Admit: 2020-08-19 | Discharge: 2020-08-19 | Disposition: A | Discharge status: Home / Self Care | Payer: Medicare Other | Source: Ambulatory Visit | Attending: Family Medicine | Admitting: Family Medicine

## 2020-08-19 ENCOUNTER — Other Ambulatory Visit (HOSPITAL_COMMUNITY): Payer: Self-pay | Admitting: Family Medicine

## 2020-08-19 ENCOUNTER — Other Ambulatory Visit: Payer: Self-pay

## 2020-08-19 DIAGNOSIS — I517 Cardiomegaly: Secondary | ICD-10-CM | POA: Insufficient documentation

## 2021-06-05 ENCOUNTER — Encounter: Payer: Self-pay | Admitting: *Deleted

## 2021-08-06 ENCOUNTER — Encounter: Payer: Self-pay | Admitting: *Deleted

## 2021-08-11 ENCOUNTER — Ambulatory Visit: Payer: PRIVATE HEALTH INSURANCE

## 2022-02-04 ENCOUNTER — Encounter: Payer: Self-pay | Admitting: *Deleted

## 2022-02-10 ENCOUNTER — Ambulatory Visit: Payer: PRIVATE HEALTH INSURANCE | Admitting: Nutrition

## 2022-03-17 ENCOUNTER — Ambulatory Visit: Payer: PRIVATE HEALTH INSURANCE | Admitting: Nutrition

## 2022-04-14 ENCOUNTER — Ambulatory Visit: Payer: PRIVATE HEALTH INSURANCE | Admitting: Nutrition

## 2022-05-11 ENCOUNTER — Encounter: Payer: 59 | Attending: Family Medicine | Admitting: Nutrition

## 2022-05-11 VITALS — Ht 65.0 in | Wt 204.4 lb

## 2022-05-11 DIAGNOSIS — E669 Obesity, unspecified: Secondary | ICD-10-CM | POA: Diagnosis not present

## 2022-05-11 DIAGNOSIS — E119 Type 2 diabetes mellitus without complications: Secondary | ICD-10-CM | POA: Diagnosis present

## 2022-05-11 DIAGNOSIS — Z713 Dietary counseling and surveillance: Secondary | ICD-10-CM | POA: Insufficient documentation

## 2022-05-11 DIAGNOSIS — E118 Type 2 diabetes mellitus with unspecified complications: Secondary | ICD-10-CM

## 2022-05-11 DIAGNOSIS — E782 Mixed hyperlipidemia: Secondary | ICD-10-CM

## 2022-05-11 NOTE — Progress Notes (Signed)
Medical Nutrition Therapy  Appointment Start time:  [id number]  Appointment End time:  46  Primary concerns today: DM Type 2, Obesity  Referral diagnosis: E1.8, D66.09 Preferred learning style: NO preference  Learning readiness: Ready  NUTRITION ASSESSMENT   She isn't testing her blood sugars Sees Dr. Karie Kirks, Drinks water  Anthropometrics  Wt Readings from Last 3 Encounters:  09/18/18 210 lb (95.3 kg)  11/04/10 207 lb (93.9 kg)   Ht Readings from Last 3 Encounters:  09/18/18 5\' 5"  (1.651 m)  11/04/10 5\' 7"  (1.702 m)   There is no height or weight on file to calculate BMI. @BMIFA @ Facility age limit for growth %iles is 20 years. Facility age limit for growth %iles is 20 years.    Clinical Medical Hx: See chart Medications: 500 mg of Metformin BID  Labs: TCHOL 280, HDL100, TG 163, LDL 150, A1C 7.8%.  Notable Signs/Symptoms:   Lifestyle & Dietary Hx    Estimated daily fluid intake: 40 oz Supplements:  Sleep: varies,, takes meds to help her sleep Stress / self-care:  Current average weekly physical activity: ADL  24-Hr Dietary Recall First Meal: Scrambled egg, 1 slice toast or ramen noodles Snack:  Second Meal: Subway-turkey, ham, lettuce, tomato, mayo, 6", water Snack: ginger snaps 3 or cheetos, Third Meal: 6" sub from lunch, water Snack: grapes, water,  Beverages: water  Estimated Energy Needs Calories: 1200 Carbohydrate: 135g Protein: 90g Fat: 33g   NUTRITION DIAGNOSIS  NB-1.1 Food and nutrition-related knowledge deficit As related to Diabetes Type 2 and Hyperlipidemia.  As evidenced by A11c 7.8% AND Tchol hyperlipidemia.   TCHOL 280, HDL100, TG 163, LDL 150,   NUTRITION INTERVENTION  Nutrition education (E-1) on the following topics:  Nutrition and Diabetes education provided on My Plate, CHO counting, meal planning, portion sizes, timing of meals, avoiding snacks between meals unless having a low blood sugar, target ranges for A1C and blood sugars,  signs/symptoms and treatment of hyper/hypoglycemia, monitoring blood sugars, taking medications as prescribed, benefits of exercising 30 minutes per day and prevention of complications of DM.  Lifestyle Medicine  - Whole Food, Plant Predominant Nutrition is highly recommended: Eat Plenty of vegetables, Mushrooms, fruits, Legumes, Whole Grains, Nuts, seeds in lieu of processed meats, processed snacks/pastries red meat, poultry, eggs.    -It is better to avoid simple carbohydrates including: Cakes, Sweet Desserts, Ice Cream, Soda (diet and regular), Sweet Tea, Candies, Chips, Cookies, Store Bought Juices, Alcohol in Excess of  1-2 drinks a day, Lemonade,  Artificial Sweeteners, Doughnuts, Coffee Creamers, "Sugar-free" Products, etc, etc.  This is not a complete list.....  Exercise: If you are able: 30 -60 minutes a day ,4 days a week, or 150 minutes a week.  The longer the better.  Combine stretch, strength, and aerobic activities.  If you were told in the past that you have high risk for cardiovascular diseases, you may seek evaluation by your heart doctor prior to initiating moderate to intense exercise programs.   Handouts Provided Include  Lifestyle Medicine Know Your numbers   Learning Style & Readiness for Change Teaching method utilized: Visual & Auditory  Demonstrated degree of understanding via: Teach Back  Barriers to learning/adherence to lifestyle change: None  Goals Established by Pt Goals  Walk 30 minutes3-4 times per week Cut out ramen noodles and junk food Drink 6 bottle of water per day Focus on eating food from of garden. 1/2 plate vegetables. Do the Full Plate Living Program. Lose 1 lb per week Get A1C  down to below 5.7%   MONITORING & EVALUATION Dietary intake, weekly physical activity, and blood sugars in 1 month.  Next Steps  Patient is to work on eating whole plant based foods at times per day as discussed.Marland Kitchen

## 2022-05-11 NOTE — Patient Instructions (Signed)
Goals  Walk 30 minutes3-4 times per week Cut out ramen noodles and junk food Drink 6 bottle of water per day Focus on eating food from of garden. 1/2 plate vegetables. Do the Full Plate Living Program. Lose 1 lb per week Get A1C down to below 5.7%

## 2022-05-26 ENCOUNTER — Encounter: Payer: Self-pay | Admitting: Nutrition

## 2022-07-13 ENCOUNTER — Ambulatory Visit: Payer: PRIVATE HEALTH INSURANCE | Admitting: Nutrition

## 2022-11-19 IMAGING — DX DG CHEST 2V
2 series · 2 of 2 positions shown · non-contrast
Comparison: Chest x-ray 11/29/2003.

CLINICAL DATA: 69-year-old female with history of enlarged heart.

EXAM:
CHEST - 2 VIEW

[chest pa]
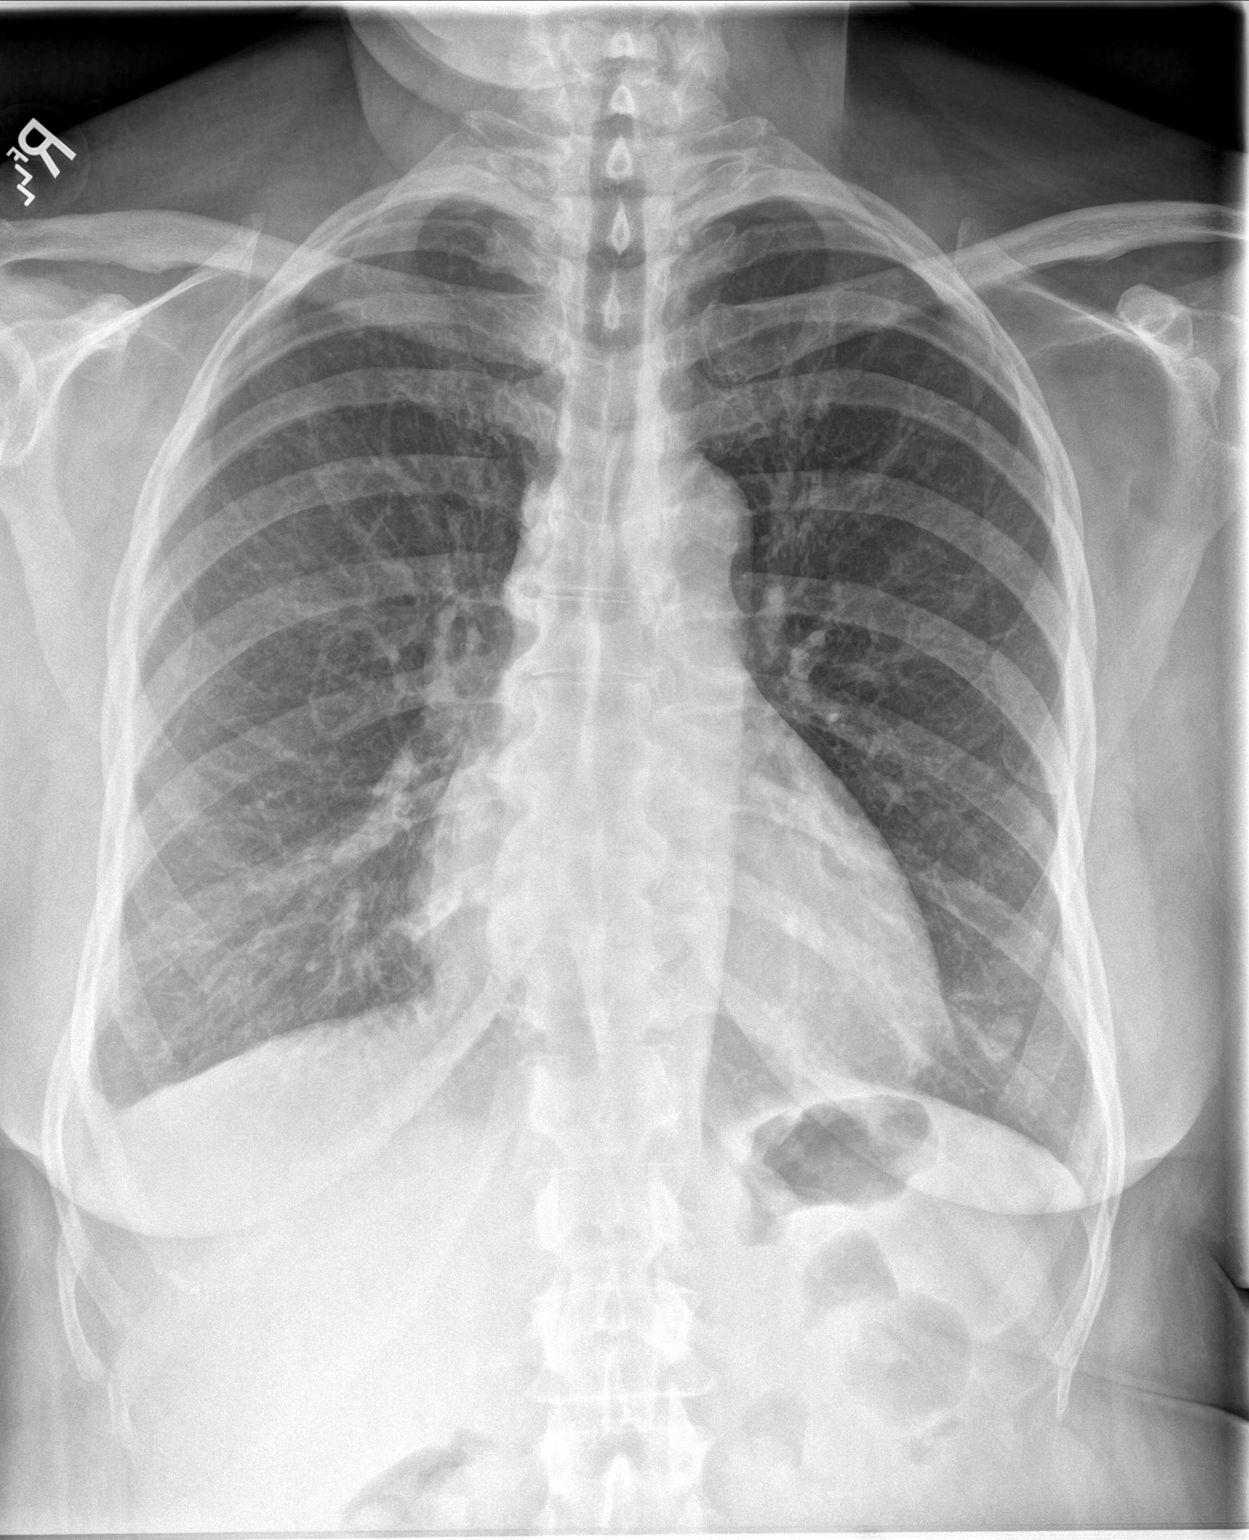

[chest lat]
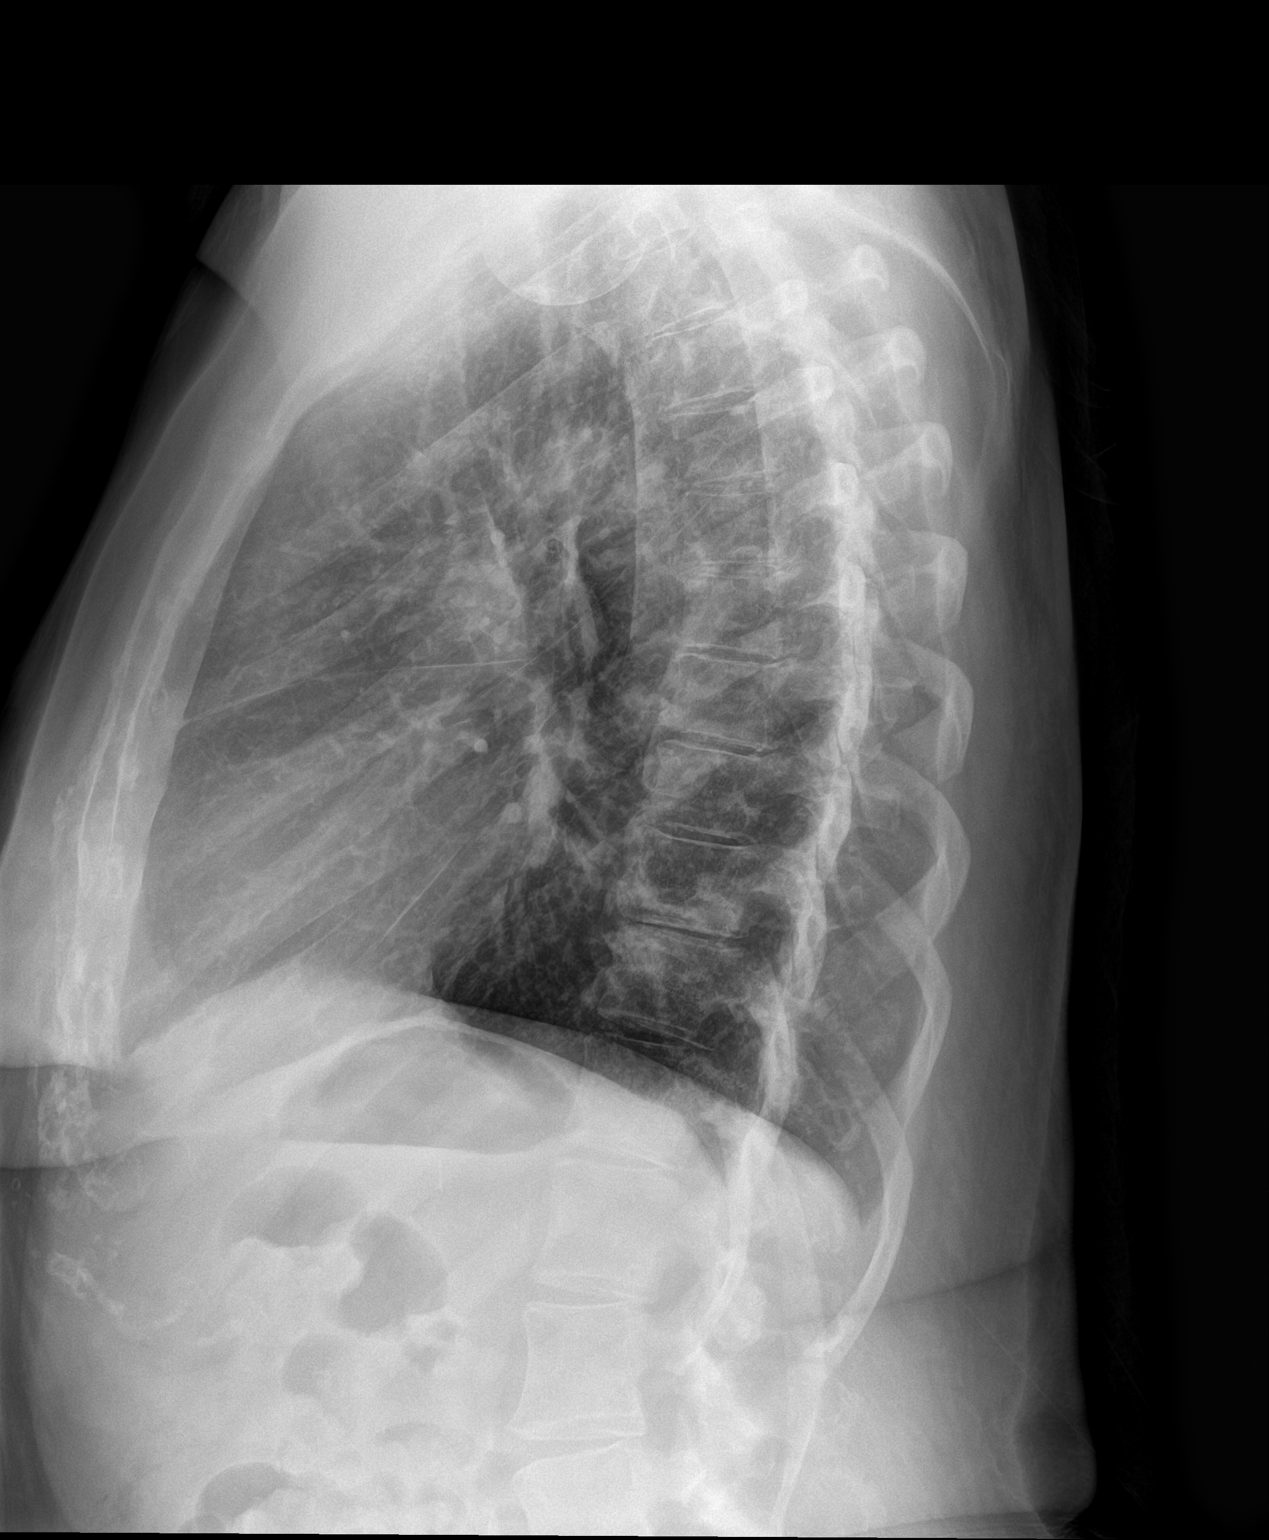

[2 of 2 positions shown; findings below may reference images not displayed]

FINDINGS: Lung volumes are normal. No consolidative airspace disease. No
pleural effusions. No pneumothorax. No pulmonary nodule or mass
noted. Pulmonary vasculature and the cardiomediastinal silhouette
are within normal limits.
IMPRESSION: No radiographic evidence of acute cardiopulmonary disease.

## 2023-07-02 ENCOUNTER — Encounter (HOSPITAL_COMMUNITY): Payer: Self-pay | Admitting: *Deleted

## 2023-07-02 ENCOUNTER — Telehealth: Payer: Self-pay | Admitting: *Deleted

## 2023-07-02 ENCOUNTER — Other Ambulatory Visit (HOSPITAL_COMMUNITY): Payer: Self-pay | Admitting: *Deleted

## 2023-07-02 ENCOUNTER — Inpatient Hospital Stay (HOSPITAL_COMMUNITY)
Admission: EM | Admit: 2023-07-02 | Discharge: 2023-07-04 | DRG: 064 | Disposition: A | Discharge status: Home / Self Care | Attending: Family Medicine | Admitting: Family Medicine

## 2023-07-02 ENCOUNTER — Other Ambulatory Visit: Payer: Self-pay

## 2023-07-02 ENCOUNTER — Observation Stay (HOSPITAL_COMMUNITY)

## 2023-07-02 ENCOUNTER — Other Ambulatory Visit (HOSPITAL_COMMUNITY): Payer: PRIVATE HEALTH INSURANCE

## 2023-07-02 ENCOUNTER — Emergency Department (HOSPITAL_COMMUNITY)

## 2023-07-02 DIAGNOSIS — I639 Cerebral infarction, unspecified: Secondary | ICD-10-CM | POA: Diagnosis not present

## 2023-07-02 DIAGNOSIS — I6389 Other cerebral infarction: Secondary | ICD-10-CM | POA: Diagnosis not present

## 2023-07-02 DIAGNOSIS — I63033 Cerebral infarction due to thrombosis of bilateral carotid arteries: Secondary | ICD-10-CM

## 2023-07-02 DIAGNOSIS — E7849 Other hyperlipidemia: Secondary | ICD-10-CM

## 2023-07-02 DIAGNOSIS — I959 Hypotension, unspecified: Secondary | ICD-10-CM | POA: Diagnosis present

## 2023-07-02 DIAGNOSIS — R29810 Facial weakness: Secondary | ICD-10-CM | POA: Diagnosis present

## 2023-07-02 DIAGNOSIS — I1A0 Resistant hypertension: Secondary | ICD-10-CM | POA: Diagnosis present

## 2023-07-02 DIAGNOSIS — I6522 Occlusion and stenosis of left carotid artery: Secondary | ICD-10-CM | POA: Diagnosis not present

## 2023-07-02 DIAGNOSIS — I10 Essential (primary) hypertension: Secondary | ICD-10-CM | POA: Diagnosis not present

## 2023-07-02 DIAGNOSIS — E118 Type 2 diabetes mellitus with unspecified complications: Secondary | ICD-10-CM | POA: Diagnosis present

## 2023-07-02 DIAGNOSIS — J45909 Unspecified asthma, uncomplicated: Secondary | ICD-10-CM | POA: Diagnosis present

## 2023-07-02 DIAGNOSIS — I6300 Cerebral infarction due to thrombosis of unspecified precerebral artery: Secondary | ICD-10-CM

## 2023-07-02 DIAGNOSIS — R29701 NIHSS score 1: Secondary | ICD-10-CM | POA: Diagnosis not present

## 2023-07-02 DIAGNOSIS — Z6833 Body mass index (BMI) 33.0-33.9, adult: Secondary | ICD-10-CM

## 2023-07-02 DIAGNOSIS — G936 Cerebral edema: Secondary | ICD-10-CM | POA: Diagnosis present

## 2023-07-02 DIAGNOSIS — Z79890 Hormone replacement therapy (postmenopausal): Secondary | ICD-10-CM

## 2023-07-02 DIAGNOSIS — E039 Hypothyroidism, unspecified: Secondary | ICD-10-CM | POA: Diagnosis present

## 2023-07-02 DIAGNOSIS — I6502 Occlusion and stenosis of left vertebral artery: Secondary | ICD-10-CM | POA: Diagnosis present

## 2023-07-02 DIAGNOSIS — I679 Cerebrovascular disease, unspecified: Secondary | ICD-10-CM | POA: Diagnosis present

## 2023-07-02 DIAGNOSIS — E785 Hyperlipidemia, unspecified: Secondary | ICD-10-CM | POA: Diagnosis present

## 2023-07-02 DIAGNOSIS — E66811 Obesity, class 1: Secondary | ICD-10-CM | POA: Diagnosis present

## 2023-07-02 DIAGNOSIS — Z7982 Long term (current) use of aspirin: Secondary | ICD-10-CM

## 2023-07-02 DIAGNOSIS — Z91010 Allergy to peanuts: Secondary | ICD-10-CM

## 2023-07-02 DIAGNOSIS — Z7984 Long term (current) use of oral hypoglycemic drugs: Secondary | ICD-10-CM

## 2023-07-02 DIAGNOSIS — E1165 Type 2 diabetes mellitus with hyperglycemia: Secondary | ICD-10-CM | POA: Diagnosis present

## 2023-07-02 DIAGNOSIS — Z7902 Long term (current) use of antithrombotics/antiplatelets: Secondary | ICD-10-CM

## 2023-07-02 DIAGNOSIS — E1151 Type 2 diabetes mellitus with diabetic peripheral angiopathy without gangrene: Secondary | ICD-10-CM | POA: Diagnosis present

## 2023-07-02 DIAGNOSIS — I6602 Occlusion and stenosis of left middle cerebral artery: Secondary | ICD-10-CM | POA: Diagnosis present

## 2023-07-02 DIAGNOSIS — Z79899 Other long term (current) drug therapy: Secondary | ICD-10-CM

## 2023-07-02 HISTORY — DX: Type 2 diabetes mellitus without complications: E11.9

## 2023-07-02 LAB — URINALYSIS, ROUTINE W REFLEX MICROSCOPIC
Bacteria, UA: NONE SEEN
Bilirubin Urine: NEGATIVE
Glucose, UA: NEGATIVE mg/dL
Hgb urine dipstick: NEGATIVE
Ketones, ur: NEGATIVE mg/dL
Leukocytes,Ua: NEGATIVE
Nitrite: NEGATIVE
Protein, ur: 100 mg/dL — AB
Specific Gravity, Urine: 1.021 (ref 1.005–1.030)
pH: 6 (ref 5.0–8.0)

## 2023-07-02 LAB — CBC
HCT: 37.3 % (ref 36.0–46.0)
Hemoglobin: 12.3 g/dL (ref 12.0–15.0)
MCH: 28.8 pg (ref 26.0–34.0)
MCHC: 33 g/dL (ref 30.0–36.0)
MCV: 87.4 fL (ref 80.0–100.0)
Platelets: 282 10*3/uL (ref 150–400)
RBC: 4.27 MIL/uL (ref 3.87–5.11)
RDW: 14.4 % (ref 11.5–15.5)
WBC: 7 10*3/uL (ref 4.0–10.5)
nRBC: 0 % (ref 0.0–0.2)

## 2023-07-02 LAB — COMPREHENSIVE METABOLIC PANEL WITH GFR
ALT: 30 U/L (ref 0–44)
AST: 29 U/L (ref 15–41)
Albumin: 3.7 g/dL (ref 3.5–5.0)
Alkaline Phosphatase: 52 U/L (ref 38–126)
Anion gap: 10 (ref 5–15)
BUN: 15 mg/dL (ref 8–23)
CO2: 22 mmol/L (ref 22–32)
Calcium: 9.5 mg/dL (ref 8.9–10.3)
Chloride: 106 mmol/L (ref 98–111)
Creatinine, Ser: 0.76 mg/dL (ref 0.44–1.00)
GFR, Estimated: >60 mL/min (ref >60)
Glucose, Bld: 139 mg/dL — ABNORMAL HIGH (ref 70–99)
Potassium: 3.6 mmol/L (ref 3.5–5.1)
Sodium: 138 mmol/L (ref 135–145)
Total Bilirubin: 0.8 mg/dL (ref 0.0–1.2)
Total Protein: 7.6 g/dL (ref 6.5–8.1)

## 2023-07-02 LAB — RAPID URINE DRUG SCREEN, HOSP PERFORMED
Amphetamines: NOT DETECTED
Barbiturates: NOT DETECTED
Benzodiazepines: NOT DETECTED
Cocaine: NOT DETECTED
Opiates: NOT DETECTED
Tetrahydrocannabinol: NOT DETECTED

## 2023-07-02 LAB — DIFFERENTIAL
Abs Immature Granulocytes: 0.02 10*3/uL (ref 0.00–0.07)
Basophils Absolute: 0 10*3/uL (ref 0.0–0.1)
Basophils Relative: 1 %
Eosinophils Absolute: 0.2 10*3/uL (ref 0.0–0.5)
Eosinophils Relative: 3 %
Immature Granulocytes: 0 %
Lymphocytes Relative: 26 %
Lymphs Abs: 1.8 10*3/uL (ref 0.7–4.0)
Monocytes Absolute: 0.4 10*3/uL (ref 0.1–1.0)
Monocytes Relative: 6 %
Neutro Abs: 4.5 10*3/uL (ref 1.7–7.7)
Neutrophils Relative %: 64 %

## 2023-07-02 LAB — GLUCOSE, CAPILLARY
Glucose-Capillary: 119 mg/dL — ABNORMAL HIGH (ref 70–99)
Glucose-Capillary: 131 mg/dL — ABNORMAL HIGH (ref 70–99)

## 2023-07-02 LAB — ECHOCARDIOGRAM COMPLETE BUBBLE STUDY
AR max vel: 2.25 cm2
AV Area VTI: 2.14 cm2
AV Area mean vel: 2.28 cm2
AV Mean grad: 3.6 mmHg
AV Peak grad: 7.5 mmHg
Ao pk vel: 1.37 m/s
Area-P 1/2: 2.54 cm2
S' Lateral: 2.1 cm

## 2023-07-02 LAB — TSH: TSH: 14.673 u[IU]/mL — ABNORMAL HIGH (ref 0.350–4.500)

## 2023-07-02 LAB — CBG MONITORING, ED
Glucose-Capillary: 205 mg/dL — ABNORMAL HIGH (ref 70–99)
Glucose-Capillary: 137 mg/dL — ABNORMAL HIGH (ref 70–99)

## 2023-07-02 LAB — LIPID PANEL
Cholesterol: 251 mg/dL — ABNORMAL HIGH (ref 0–200)
HDL: 100 mg/dL (ref >40)
LDL Cholesterol: 131 mg/dL — ABNORMAL HIGH (ref 0–99)
Total CHOL/HDL Ratio: 2.5 ratio
Triglycerides: 102 mg/dL (ref <150)
VLDL: 20 mg/dL (ref 0–40)

## 2023-07-02 LAB — HEMOGLOBIN A1C
Hgb A1c MFr Bld: 7.4 % — ABNORMAL HIGH (ref 4.8–5.6)
Mean Plasma Glucose: 165.68 mg/dL

## 2023-07-02 LAB — ETHANOL: Alcohol, Ethyl (B): <15 mg/dL (ref <15)

## 2023-07-02 MED ORDER — PERFLUTREN LIPID MICROSPHERE
1.0000 mL | INTRAVENOUS | Status: AC | PRN
  Administered 2023-07-02: 3 mL via INTRAVENOUS

## 2023-07-02 MED ORDER — LEVOTHYROXINE SODIUM 88 MCG PO TABS
88.0000 ug | ORAL_TABLET | Freq: Every day | ORAL | Status: DC
  Administered 2023-07-03: 88 ug via ORAL
  Filled 2023-07-02: qty 1

## 2023-07-02 MED ORDER — INSULIN ASPART 100 UNIT/ML IJ SOLN: 0.0000 [IU] | Freq: Every day | INTRAMUSCULAR | Status: DC

## 2023-07-02 MED ORDER — SODIUM CHLORIDE 0.9% FLUSH
3.0000 mL | Freq: Two times a day (BID) | INTRAVENOUS | Status: DC
  Administered 2023-07-02 – 2023-07-04 (×4): 3 mL via INTRAVENOUS

## 2023-07-02 MED ORDER — SODIUM CHLORIDE 0.9% FLUSH: 3.0000 mL | INTRAVENOUS | Status: DC | PRN

## 2023-07-02 MED ORDER — ASPIRIN 325 MG PO TABS: 325.0000 mg | ORAL_TABLET | Freq: Once | ORAL | Status: DC

## 2023-07-02 MED ORDER — HYDRALAZINE HCL 20 MG/ML IJ SOLN
10.0000 mg | Freq: Once | INTRAMUSCULAR | Status: AC
  Administered 2023-07-02: 10 mg via INTRAVENOUS
  Filled 2023-07-02: qty 1

## 2023-07-02 MED ORDER — SODIUM CHLORIDE 0.9 % IV SOLN: INTRAVENOUS | Status: AC | PRN

## 2023-07-02 MED ORDER — ASPIRIN 325 MG PO TABS
325.0000 mg | ORAL_TABLET | Freq: Every day | ORAL | Status: DC
  Administered 2023-07-02: 325 mg via ORAL
  Filled 2023-07-02: qty 1

## 2023-07-02 MED ORDER — POLYETHYLENE GLYCOL 3350 17 G PO PACK: 17.0000 g | PACK | Freq: Every day | ORAL | Status: DC | PRN

## 2023-07-02 MED ORDER — LABETALOL HCL 5 MG/ML IV SOLN
20.0000 mg | Freq: Once | INTRAVENOUS | Status: AC
  Administered 2023-07-02: 20 mg via INTRAVENOUS
  Filled 2023-07-02: qty 4

## 2023-07-02 MED ORDER — IOHEXOL 350 MG/ML SOLN
100.0000 mL | Freq: Once | INTRAVENOUS | Status: AC | PRN
  Administered 2023-07-02: 75 mL via INTRAVENOUS

## 2023-07-02 MED ORDER — TRAZODONE HCL 50 MG PO TABS
50.0000 mg | ORAL_TABLET | Freq: Every evening | ORAL | Status: DC | PRN
  Administered 2023-07-03: 50 mg via ORAL
  Filled 2023-07-02: qty 1

## 2023-07-02 MED ORDER — LORAZEPAM 1 MG PO TABS: 1.0000 mg | ORAL_TABLET | Freq: Three times a day (TID) | ORAL | Status: DC | PRN

## 2023-07-02 MED ORDER — ONDANSETRON HCL 4 MG PO TABS: 4.0000 mg | ORAL_TABLET | Freq: Four times a day (QID) | ORAL | Status: DC | PRN

## 2023-07-02 MED ORDER — MECLIZINE HCL 12.5 MG PO TABS: 25.0000 mg | ORAL_TABLET | Freq: Three times a day (TID) | ORAL | Status: DC | PRN

## 2023-07-02 MED ORDER — ONDANSETRON HCL 4 MG/2ML IJ SOLN: 4.0000 mg | Freq: Four times a day (QID) | INTRAMUSCULAR | Status: DC | PRN

## 2023-07-02 MED ORDER — CLONIDINE HCL 0.1 MG PO TABS
0.1000 mg | ORAL_TABLET | Freq: Two times a day (BID) | ORAL | Status: DC
  Filled 2023-07-02: qty 1

## 2023-07-02 MED ORDER — SODIUM CHLORIDE 0.9 % IV BOLUS
1000.0000 mL | Freq: Once | INTRAVENOUS | Status: AC
  Administered 2023-07-02: 1000 mL via INTRAVENOUS

## 2023-07-02 MED ORDER — ACETAMINOPHEN 650 MG RE SUPP: 650.0000 mg | Freq: Four times a day (QID) | RECTAL | Status: DC | PRN

## 2023-07-02 MED ORDER — SODIUM CHLORIDE 0.9% FLUSH
3.0000 mL | Freq: Two times a day (BID) | INTRAVENOUS | Status: DC
  Administered 2023-07-03 – 2023-07-04 (×2): 3 mL via INTRAVENOUS

## 2023-07-02 MED ORDER — ATORVASTATIN CALCIUM 40 MG PO TABS
40.0000 mg | ORAL_TABLET | Freq: Every day | ORAL | Status: DC
  Administered 2023-07-02 – 2023-07-04 (×3): 40 mg via ORAL
  Filled 2023-07-02 (×3): qty 1

## 2023-07-02 MED ORDER — ACETAMINOPHEN 325 MG PO TABS: 650.0000 mg | ORAL_TABLET | Freq: Four times a day (QID) | ORAL | Status: DC | PRN

## 2023-07-02 MED ORDER — HYDRALAZINE HCL 20 MG/ML IJ SOLN
10.0000 mg | INTRAMUSCULAR | Status: DC | PRN
  Administered 2023-07-02 – 2023-07-03 (×4): 10 mg via INTRAVENOUS
  Filled 2023-07-02 (×4): qty 1

## 2023-07-02 MED ORDER — BISACODYL 10 MG RE SUPP: 10.0000 mg | Freq: Every day | RECTAL | Status: DC | PRN

## 2023-07-02 MED ORDER — CLOPIDOGREL BISULFATE 75 MG PO TABS
300.0000 mg | ORAL_TABLET | Freq: Once | ORAL | Status: AC
  Administered 2023-07-02: 300 mg via ORAL
  Filled 2023-07-02: qty 4

## 2023-07-02 MED ORDER — CLOPIDOGREL BISULFATE 75 MG PO TABS
75.0000 mg | ORAL_TABLET | Freq: Every day | ORAL | Status: DC
  Administered 2023-07-03 – 2023-07-04 (×2): 75 mg via ORAL
  Filled 2023-07-02 (×2): qty 1

## 2023-07-02 MED ORDER — MECLIZINE HCL 12.5 MG PO TABS
25.0000 mg | ORAL_TABLET | Freq: Once | ORAL | Status: AC
  Administered 2023-07-02: 25 mg via ORAL
  Filled 2023-07-02: qty 2

## 2023-07-02 MED ORDER — HEPARIN SODIUM (PORCINE) 5000 UNIT/ML IJ SOLN
5000.0000 [IU] | Freq: Three times a day (TID) | INTRAMUSCULAR | Status: DC
  Administered 2023-07-02 – 2023-07-04 (×5): 5000 [IU] via SUBCUTANEOUS
  Filled 2023-07-02 (×5): qty 1

## 2023-07-02 MED ORDER — ASPIRIN 81 MG PO TBEC
81.0000 mg | DELAYED_RELEASE_TABLET | Freq: Every day | ORAL | Status: DC
  Administered 2023-07-03 – 2023-07-04 (×2): 81 mg via ORAL
  Filled 2023-07-02 (×2): qty 1

## 2023-07-02 MED ORDER — INSULIN ASPART 100 UNIT/ML IJ SOLN
0.0000 [IU] | Freq: Three times a day (TID) | INTRAMUSCULAR | Status: DC
  Administered 2023-07-03: 1 [IU] via SUBCUTANEOUS
  Administered 2023-07-03 – 2023-07-04 (×2): 2 [IU] via SUBCUTANEOUS

## 2023-07-02 MED ORDER — CHLORHEXIDINE GLUCONATE CLOTH 2 % EX PADS
6.0000 | MEDICATED_PAD | Freq: Every day | CUTANEOUS | Status: DC
  Administered 2023-07-03 – 2023-07-04 (×2): 6 via TOPICAL

## 2023-07-02 NOTE — Discharge Instructions (Signed)
  Providers Accepting New Patients in [place], [place]    [facility] [address]  [place], [place] [address] [phone] Accepts most insurances  [facility] [address] [place], [place] [address] [phone] Accepts most insurances  [facility] [address] [place], [place] [address]  [phone] Must meet requirements  [facility] [address] [place], [place] [address] [phone] Accepts most insurances  [facility] [address]  [place], [place] [address] [phone] Accepts most insurances  [facility] [address]   [place], [place]   [phone] Accepts most insurances  [facility])  [phone] [address]  [place], [place] [address] [phone] Accepts most insurances  [facility] [address]  [place], [place] [address] [phone] Accepts most insurances  [facility] Department [address] [place], [place] [address] [phone] option 1 Accepts Medicaid and [facility] [address]  [place], [place] [address] [phone] Accepts most insurances  Clary Crown, MD [address] [place], [place] [address] [phone] Accepts most insurances  [facility] [address]  [place], [place] [address] [phone] Accepts most insurances  [facility] [phone] [address] [place], [place] [address] [phone] Accepts most insurances  [place] [place] [address] [place], [place] [address] [phone]  Accepts most insurances

## 2023-07-02 NOTE — Care Management Obs Status (Signed)
 MEDICARE OBSERVATION STATUS NOTIFICATION   Patient Details  Name: Leslie Lindsey MRN: [id number] Date of Birth: [birth year]   Medicare Observation Status Notification Given:  Yes    Geraldina Klinefelter, RN 07/02/2023, 10:20 PM

## 2023-07-02 NOTE — H&P (Signed)
 History and Physical    Patient: Leslie Lindsey ZOX:[id number] DOB: [birth year] DOA: 07/02/2023 DOS: the patient was seen and examined on 07/02/2023 PCP: Pcp, No  Patient coming from: Home  Chief Complaint:  Chief Complaint  Patient presents with   Dizziness   HPI: Leslie Lindsey is a 73 y.o. female with medical history significant of Asthma, DM2, uncontrolled HTN, obesity, and hypothyroidism who presents to the ED with complaints of dizziness, left-sided facial numbness headaches for about a week or so.  - While in the ED BP was elevated and patient received IV hydralazine  and labetalol  in the ED apparently--subsequently Pt stood to get to [facility], while sitting on BSC, but became dizzy, diaphoretic, began to slur her words and was pre syncopal. Pt noted to be hypotensive 98/42, pt placed back in bed by NT and 2 RNs. ---EDP gave IV fluid boluses and BP recovered -- No fever  Or chills  No chest pain no palpitations no dyspnea no pleuritic symptoms -Repeat evaluation shows orthostatic vitals as noted below:- Orthostatic Vitals performed with results as follows:   Position:          Time:               Blood Pressure:                      Pulse:   Lying                1812                202/56                                     89   Sitting              1815                225/68                                     100                                             Standing(0 min)1817              197/76                                     105   Standing (39min)1820              174/71                                     106  Additional history obtained from patient Sister Altamease Asters at bedside  In the ED CBC WNL, LFTs WNL creatinine 0.76 electrolytes WNL - LDL 131, HDL 100, total cholesterol 251, triglycerides 102 -- A1c 7.4 - UDS negative, UA is not suggestive of UTI, blood alcohol level negative - CTA head and neck with asymptomatic carotid stenosis on the left, moderate to severe  intracranial stenosis of multiple arteries (  Occluded Right Vertebral Artery, Severe stenosis of the Left Vertebral Artery V4 segment, with mildly diminished enhancement of the Basilar, Severe stenosis of the distal 3rd of the Basilar Artery. This might directly correspond to the perforator infarct, Moderate Right and Severe Left ICA siphon due to bulky calcified plaque. And upstream 60-70% estimated stenosis of the left ICA origin due to bulky calcified plaque. 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 Confluent cytotoxic edema with no hemorrhage or mass effect.  Review of Systems: As mentioned in the history of present illness. All other systems reviewed and are negative. Past Medical History:  Diagnosis Date   Asthma    Diabetes mellitus without complication (HCC)    Hypertension    Hyperthyroidism    PONV (postoperative nausea and vomiting)    Shortness of breath    Past Surgical History:  Procedure Laterality Date   EYE SURGERY     30 years ago   HYSTEROSCOPY WITH D & C  11/11/2010   Procedure: DILATATION AND CURETTAGE (D&C) /HYSTEROSCOPY;  Surgeon: Albino Hum, MD;  Location: [facility];  Service: Gynecology;  Laterality: N/A;  WITH REMOVAL ENDOMETRIAL POLYP   Social History:  reports that she has never smoked. She has never used smokeless tobacco. She reports that she does not drink alcohol and does not use drugs.  Allergies  Allergen Reactions   Peanut-Containing Drug Products Anaphylaxis    Family History  Problem Relation Age of Onset   Anesthesia problems Neg Hx    Hypotension Neg Hx    Malignant hyperthermia Neg Hx    Pseudochol deficiency Neg Hx     Prior to Admission medications   Medication Sig Start Date End Date Taking? Authorizing  Provider  acetaminophen (TYLENOL) 500 MG tablet Take 500 mg by mouth every 6 (six) hours as needed. For pain   Yes [provider]  albuterol (VENTOLIN HFA) 108 (90 Base) MCG/ACT inhaler Inhale 2 puffs into the lungs every 4 (four) hours as needed for wheezing or shortness of breath. 02/13/23  Yes [provider]  cloNIDine (CATAPRES) 0.2 MG tablet Take 0.2 mg by mouth 2 (two) times daily. 05/29/23  Yes [provider]  diphenhydrAMINE (SOMINEX) 25 MG tablet Take 25 mg by mouth at bedtime as needed for allergies or itching. For allergies   Yes [provider]  hydrochlorothiazide (HYDRODIURIL) 12.5 MG tablet Take 12.5 mg by mouth daily as needed (Swelling). 05/08/23  Yes [provider]  levothyroxine (SYNTHROID) 88 MCG tablet Take 88 mcg by mouth daily. 05/23/23  Yes [provider]  lisinopril (ZESTRIL) 40 MG tablet Take 40 mg by mouth daily. 05/05/23  Yes [provider]  LORazepam (ATIVAN) 1 MG tablet Take 1 mg by mouth 3 (three) times daily as needed for anxiety. 01/13/23  Yes [provider]  metFORMIN (GLUCOPHAGE) 500 MG tablet Take 500 mg by mouth daily.   Yes [provider]  atorvastatin  (LIPITOR) 10 MG tablet Take 10 mg by mouth daily. Patient not taking: Reported on 07/02/2023 05/03/23   [provider]  Potassium Chloride ER 20 MEQ TBCR Take 20 mEq by mouth daily. Patient not taking: Reported on 07/02/2023 04/10/23   [provider]    Physical Exam: Vitals:   07/02/23 1812 07/02/23 1815 07/02/23  1817 07/02/23 1820  BP:    (!) 174/71  Pulse:    (!) 103  Resp:    18  Temp:      TempSrc:      SpO2: 100% 100% 100% 100%  Weight:      Height:        Physical Exam  Gen:- Awake Alert, in no acute distress  HEENT:- [place].AT, No sclera icterus Neck-Supple Neck,No JVD,.  Lungs-  CTAB , fair air movement bilaterally  CV- S1, S2 normal, RRR Abd-  +ve B.Sounds, Abd Soft, No tenderness,     Extremity/Skin:- No  edema,   good pedal pulses  Psych-affect is appropriate, oriented x3 Neuro-left-sided numbness of the face and left upper extremity mostly, no tremors  Data Reviewed: CBC WNL, LFTs WNL creatinine 0.76 electrolytes WNL - LDL 131, HDL 100, total cholesterol 251, triglycerides 102 -- A1c 7.4 - UDS negative, UA is not suggestive of UTI, blood alcohol level negative - CTA head and neck with asymptomatic carotid stenosis on the left, moderate to severe intracranial stenosis of multiple arteries (Occluded Right Vertebral Artery, Severe stenosis of the Left Vertebral Artery V4 segment, with mildly diminished enhancement of the Basilar, Severe stenosis of the distal 3rd of the Basilar Artery. This might directly correspond to the perforator infarct, Moderate Right and Severe Left ICA siphon due to bulky calcified plaque. And upstream 60-70% estimated stenosis of the left ICA origin due to bulky calcified plaque. 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 Confluent cytotoxic edema with no hemorrhage or mass effect.   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 - Neurology consult appreciated -- LDL 131, HDL 100, total cholesterol 251, triglycerides 102 -- A1c 7.4 -place on telemetry monitored unit,  - echocardiogram without intracardiac thrombus, EF 65 to 70%, grade 1 diastolic dysfunction noted, moderate LVH noted, no aortic stenosis, no mitral stenosis - Neuroconsult appreciated - PT/OT eval pending,  - We will allow some permissive hypertension in view of possible acute stroke, avoid precipitous drop in blood pressure. Use iv Labetalol  10 mg iv every 4 hrs as needed  for systolic blood pressure over 210 mmhg , - TSH pending Give Aspirin  81 mg daily along with Plavix  75 mg daily for 90 days then after that STOP the Plavix   and continue ONLY Aspirin  81 mg daily indefinitely--for secondary stroke Prevention (Per The multicenter SAMMPRIS trial) # Maximize blood glucose control with insulin coverage (goal FS 60-180) #Please maintain euthermia. Tylenol prn for temp >100.4 # Frequent neuro checks - Give Lipitor  2)PAD--intracranial stenosis - CTA head and neck shows asymptomatic carotid stenosis on the left, moderate to severe intracranial stenosis of multiple arteries (Occluded Right Vertebral Artery, Severe stenosis of the Left Vertebral Artery V4 segment, with mildly diminished enhancement of the Basilar, Severe stenosis of the distal 3rd of the Basilar Artery. This might directly correspond to the perforator infarct, Moderate Right and Severe Left ICA siphon due to bulky calcified plaque. And upstream 60-70% estimated stenosis of the left ICA origin due to bulky calcified plaque. 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 - Aspirin  Plavix  and Lipitor as above - Outpatient follow-up with vascular surgeon advised  3)HTN--- permissive hypertension as above, patient is somewhat orthostatic and had a near syncopal episode after receiving IV hydralazine  and IV labetalol  in the ED -Use iv Labetalol  10 mg iv every 4 hrs as needed for systolic blood pressure over 210 mmhg ,  4)DM2-A1c 7.4 reflecting uncontrolled DM with hyperglycemia PTA Use Novolog/Humalog Sliding scale insulin with Accu-Cheks/Fingersticks as ordered   5) class I obesity- -Low calorie diet, portion control and increase physical activity discussed with patient -Body mass index is 32.52 kg/m.  6)Hypothyroidism----continue levothyroxine  7)Asthma-no acute  exacerbation, as needed bronchodilators as ordered  8) orthostatic dizziness and near syncope----repeat CT head without new additional findings or for previously noted findings of acute stroke -While in the ED BP was elevated and patient received IV hydralazine  and labetalol  in the ED apparently--subsequently Pt stood to get to [facility], while sitting on BSC, but became dizzy, diaphoretic, began to slur her words and was pre syncopal. Pt noted to be hypotensive 98/42, pt placed back in bed by NT and 2 RNs. ---EDP gave IV fluid boluses and BP recovered No chest pain no palpitations no dyspnea no pleuritic symptoms -Repeat evaluation shows orthostatic vitals as noted below:- Orthostatic Vitals performed with results as follows:   Position:          Time:               Blood Pressure:                      Pulse:   Lying                1812                202/56                                     89   Sitting              1815                225/68                                     100                                             Standing(0 min)1817              197/76                                     105   Standing (6min)1820              174/71                                     106 --- Allow permissive hypertension as above #1   Advance Care Planning:   Code Status: Full Code   Consults: Neuro  Family Communication: Sister Altamease Asters at bedside  Severity of Illness: The appropriate patient status for this patient is OBSERVATION. Observation status is judged to be reasonable and necessary in order to provide the required intensity of service to ensure the patient's safety. The patient's presenting symptoms, physical exam findings, and initial radiographic and laboratory data in the context of their medical condition is felt to place them at decreased risk for further clinical deterioration. Furthermore, it is anticipated that the patient will be medically stable for discharge from the hospital  within 2 midnights of admission.   Author: Colin Dawley, MD 07/02/2023 6:35 PM  For on call review [email or web address].

## 2023-07-02 NOTE — Progress Notes (Signed)
   07/02/23 2220  TOC Brief Assessment  Insurance and Status Reviewed  Patient has primary care physician No (PCP list added to AVS)  Home environment has been reviewed From home c/son  Prior level of function: Independent  Prior/Current Home Services No current home services  Social Drivers of Health Review SDOH reviewed no interventions necessary  Readmission risk has been reviewed Yes  Transition of care needs no transition of care needs at this time   Transition of Care Department [facility]) has reviewed patient and will continue to monitor.

## 2023-07-02 NOTE — Progress Notes (Signed)
 PT Cancellation Note  Patient Details Name: Leslie Lindsey MRN: [id number] DOB: [birth year]   Cancelled Treatment:    Reason Eval/Treat Not Completed: Patient not medically ready. Pt was currently having syncopal episode and being seen by nursing and ED doctor. PT held due to nursing staff stating now is not a good time.   Armond Bertin, PT, DPT [facility] Office: [phone] 12:07 PM, 07/02/23

## 2023-07-02 NOTE — Plan of Care (Signed)
  Problem: Clinical Measurements: Goal: Ability to maintain clinical measurements within normal limits will improve Outcome: Progressing Goal: Cardiovascular complication will be avoided Outcome: Progressing   Problem: Activity: Goal: Risk for activity intolerance will decrease Outcome: Progressing   Problem: Nutrition: Goal: Adequate nutrition will be maintained Outcome: Progressing   Problem: Coping: Goal: Level of anxiety will decrease Outcome: Progressing   Problem: Elimination: Goal: Will not experience complications related to urinary retention Outcome: Progressing   Problem: Pain Managment: Goal: General experience of comfort will improve and/or be controlled Outcome: Progressing

## 2023-07-02 NOTE — Telephone Encounter (Signed)
 Request from Dr. Quintella Buck for 30 day event monitor for stroke. Pt enrolled in [employer] and order placed.

## 2023-07-02 NOTE — Progress Notes (Signed)
*  PRELIMINARY RESULTS* Echocardiogram 2D Echocardiogram has been performed with Saline Bubble study and Definity.  Leslie Lindsey 07/02/2023, 4:03 PM

## 2023-07-02 NOTE — ED Triage Notes (Addendum)
 Pt c/o dizziness, headache, and numbness to left side of face x one week. Pt reports this morning when she went to the mailbox she noticed her left hand felt numb as well. Dizziness, headache, and facial numbness has been constant since it started.

## 2023-07-02 NOTE — ED Notes (Signed)
 Pt stood to get to [place], while sitting on BSC, but became dizzy, diaphoretic, began to slur her words and was pre syncopal.  Pt noted to be hypotensive 98/42, pt placed back in bed by NT and 2 RNs.  Dr. Adan Holms to room, fluid bolus started, BP recovering (see flowsheet).  Pt more awake at this time, no new neuro deficits noted.

## 2023-07-02 NOTE — Consult Note (Addendum)
 I connected with  Leslie Lindsey on 07/02/23 by a video enabled telemedicine application and verified that I am speaking with the correct person using two identifiers.   I discussed the limitations of evaluation and management by telemedicine. The patient expressed understanding and agreed to proceed.  Location of patient: [facility] Outpatient physician: [facility]  Neurology Consultation Reason for Consult: Stroke Referring Physician: Dr. Sadie Crank  CC: Headache, dizziness, numbness on left side of face for 1 week  History is obtained from: patient,   HPI: Leslie Lindsey is a 73 y.o. female with past medical history of hypertension, hyperlipidemia, diabetes who presented with dizziness, headache and numbness to the left side of face as well as left hand for about a week.  Unfortunately patient cannot tell me the exact date but states she was normal when she went to bed about a week ago and when she woke up in the morning she noticed all the symptoms.  Does not take any antiplatelets/anticoagulants  Last known normal: Unclear but about 1 week ago Event happened at home No tPA as outside window No thrombectomy as no LVO mRS 0   ROS: All other systems reviewed and negative except as noted in the HPI.   Past Medical History:  Diagnosis Date   Asthma    Diabetes mellitus without complication (HCC)    Hypertension    Hyperthyroidism    PONV (postoperative nausea and vomiting)    Shortness of breath     Family History  Problem Relation Age of Onset   Anesthesia problems Neg Hx    Hypotension Neg Hx    Malignant hyperthermia Neg Hx    Pseudochol deficiency Neg Hx     Social History:  reports that she has never smoked. She has never used smokeless tobacco. She reports that she does not drink alcohol and does not use drugs.   Exam: Current vital signs: BP (!) 234/100   Pulse 70   Temp 98.9 F (37.2 C) (Oral)   Resp 14   Ht 5\' 5"  (1.651 m)    Wt 90.7 kg   SpO2 97%   BMI 33.28 kg/m  Vital signs in last 24 hours: Temp:  [98.9 F (37.2 C)] 98.9 F (37.2 C) (05/02 0714) Pulse Rate:  [70-92] 70 (05/02 0945) Resp:  [14-18] 14 (05/02 0945) BP: (148-242)/(84-134) 234/100 (05/02 0945) SpO2:  [96 %-99 %] 97 % (05/02 0945) Weight:  [90.7 kg] 90.7 kg (05/02 0711)   Physical Exam  Constitutional: Appears well-developed and well-nourished.  Psych: Affect appropriate to situation Neuro: AO x 3, no aphasia, follows commands, cranial nerves grossly intact, antigravity strength without drift in all 4 extremities, decree sensation to touch in left side of face and left hand, FTN intact bilaterally  NIHSS 1   INPUTS: 1A: Level of consciousness --> 0 = Alert; keenly responsive 1B: Ask month and age --> 0 = Both questions right 1C: 'Blink eyes' & 'squeeze hands' --> 0 = Performs both tasks 2: Horizontal extraocular movements --> 0 = Normal 3: Visual fields --> 0 = No visual loss 4: Facial palsy --> 0 = Normal symmetry 5A: Left arm motor drift --> 0 = No drift for 10 seconds 5B: Right arm motor drift --> 0 = No drift for 10 seconds 6A: Left leg motor drift --> 0 = No drift for 5 seconds 6B: Right leg motor drift --> 0 = No drift for 5 seconds 7: Limb Ataxia --> 0 =  No ataxia 8: Sensation --> 1 = Mild-moderate loss: less sharp/more dull  9: Language/aphasia --> 0 = Normal; no aphasia 10: Dysarthria --> 0 = Normal 11: Extinction/inattention --> 0 = No abnormality   I have reviewed labs in epic and the results pertinent to this consultation are: CBC:  Recent Labs  Lab 07/02/23 0739  WBC 7.0  NEUTROABS 4.5  HGB 12.3  HCT 37.3  MCV 87.4  PLT 282    Basic Metabolic Panel:  Lab Results  Component Value Date   NA 138 07/02/2023   K 3.6 07/02/2023   CO2 22 07/02/2023   GLUCOSE 139 (H) 07/02/2023   BUN 15 07/02/2023   CREATININE 0.76 07/02/2023   CALCIUM  9.5 07/02/2023   GFRNONAA >60 07/02/2023   GFRAA >60 11/04/2010    Lipid Panel: No results found for: "LDLCALC" HgbA1c: No results found for: "HGBA1C" Urine Drug Screen:     Component Value Date/Time   LABOPIA NONE DETECTED 07/02/2023 0916   COCAINSCRNUR NONE DETECTED 07/02/2023 0916   LABBENZ NONE DETECTED 07/02/2023 0916   AMPHETMU NONE DETECTED 07/02/2023 0916   THCU NONE DETECTED 07/02/2023 0916   LABBARB NONE DETECTED 07/02/2023 0916    Alcohol Level     Component Value Date/Time   ETH <15 07/02/2023 0739     I have reviewed the images obtained:  MRI brain without contrast 07/02/2023: Positive for a relatively 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. Confluent cytotoxic edema with no hemorrhage or mass effect.  No other acute intracranial abnormality, but abnormal flow void of the distal right vertebral artery might indicate hemodynamically significant stenosis or occlusion. A CTA is pending at this time. Mild for age nonspecific white matter signal changes, probably due to small vessel disease.  CTA head and neck with and without contrast 07/02/2023: Occluded Right Vertebral Artery, both at the origin and the V4 segment with only thread-like reconstitution in between. - Severe stenosis of the Left Vertebral Artery V4 segment, with mildly diminished enhancement of the Basilar. - Severe stenosis of the distal 3rd of the Basilar Artery. This might directly correspond to the perforator infarct.  2. Positive also for up to Moderate Right and Severe Left ICA siphon due to bulky calcified plaque. And upstream 60-70% estimated stenosis of the left ICA origin due to bulky calcified plaque. 50% stenosis Right CCA due to bulky calcified plaque.  3. 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  4. Up to Moderate stenosis of the Left MCA M1 Left ACA A1 segments.     ASSESSMENT/PLAN: 73 year old female presented to [facility] with left facial and hand numbness, dizziness and  headache.  Acute ischemic stroke Etiology: Likely due to intracranial stenosis versus embolic  Recommendations: - Will give loading dose of aspirin  and Plavix  and then start on aspirin  81 mg daily and Plavix  75 mg daily for 3 months followed by monotherapy with aspirin  81 mg daily - Will check LDL and A1c.  If LDL more than 70, recommend atorvastatin  40 mg daily -TTE ordered and pending.  If negative for thrombus, recommend 30-day cardiac monitor - Goal blood pressure: Gradual normotension.  Patient has significant intracranial stenosis.  Therefore if symptoms recur or worsen, please repeat imaging and check blood pressure - PT/OT -Swallow eval, -Stroke education -Okay to use meclizine  or Valium 2.5 mg Q12 hours for dizziness -Okay to admit at [facility] for further workup.  Please call us  for any further questions -  Follow-up with neurology in 2 to 3 months (order placed) - Discussed plan with Dr. Adan Holms via secure chat  Asymptomatic carotid stenosis, left - Recommend Vascular surgery consult   Thank you for allowing us  to participate in the care of this patient. If you have any further questions, please contact  me or neurohospitalist.   Roxy Cordial Epilepsy [facility] neurohospitalist

## 2023-07-02 NOTE — ED Notes (Addendum)
 Pt ambulated to bathroom with cane, steady gait and denied dizziness. Pt is back in bed and resting comfortably at this time.

## 2023-07-02 NOTE — ED Provider Notes (Signed)
 [place] EMERGENCY DEPARTMENT AT [facility] Provider Note   CSN: [id number] Arrival date & time: 07/02/23  0703     History {Add pertinent medical, surgical, social history, OB history to HPI:1} Chief Complaint  Patient presents with   Dizziness    Leslie Lindsey is a 73 y.o. female.  73 year old female with a history of diabetes and hypertension who presents emergency department with dizziness and facial droop.  Patient reports that a week ago she woke up and felt vertiginous.  Constant.  Not worsened with head movement.  Is present at rest.  Also reports that the right side of her face seemed to be drooping as well.  Denies headache to me though did endorse this in triage.  Denies any numbness to me.  Took her lisinopril this morning but not her clonidine.  Thinks she may have had an ear infection 2 weeks ago but did not see any doctors about it.       Home Medications Prior to Admission medications   Medication Sig Start Date End Date Taking? Authorizing Provider  acetaminophen (TYLENOL) 500 MG tablet Take 500 mg by mouth every 6 (six) hours as needed. For pain Patient not taking: Reported on 05/11/2022    [provider]  amoxicillin -clavulanate (AUGMENTIN ) 875-125 MG tablet Take 1 tablet by mouth every 12 (twelve) hours. Patient not taking: Reported on 05/11/2022 09/18/18   Lyna Sandhoff, PA-C  calcium  & magnesium carbonates (MYLANTA) 311-232 MG per tablet Take 1 tablet by mouth daily.  Patient not taking: Reported on 05/11/2022    [provider]  cloNIDine (CATAPRES - DOSED IN MG/24 HR) 0.1 mg/24hr patch Place 0.1 mg onto the skin once a week.    [provider]  Cod Liver Oil 1000 MG CAPS Take 1,000 mg by mouth daily.  Patient not taking: Reported on 05/11/2022    [provider]  diphenhydrAMINE (SOMINEX) 25 MG tablet Take 25 mg by mouth at bedtime as needed. For allergies Patient not taking: Reported on 05/11/2022     [provider]  enalapril (VASOTEC) 20 MG tablet Take 20 mg by mouth daily.     [provider]  hydrochlorothiazide 50 MG tablet Take 25 mg by mouth daily.     [provider]  levothyroxine (SYNTHROID, LEVOTHROID) 100 MCG tablet Take 100 mcg by mouth daily.     [provider]  metFORMIN (GLUCOPHAGE) 500 MG tablet Take 500 mg by mouth 2 (two) times daily with a meal.    [provider]  metoprolol  (LOPRESSOR ) 50 MG tablet Take 25 mg by mouth 2 (two) times daily.  Patient not taking: Reported on 05/11/2022    [provider]      Allergies    Peanut-containing drug products    Review of Systems   Review of Systems  Physical Exam Updated Vital Signs BP (!) 220/104 (BP Location: Left Arm)   Pulse 88   Temp 98.9 F (37.2 C) (Oral)   Resp 18   Ht 5\' 5"  (1.651 m)   Wt 90.7 kg   SpO2 96%   BMI 33.28 kg/m  Physical Exam Vitals and nursing note reviewed.  Constitutional:      General: She is not in acute distress.    Appearance: She is well-developed.  HENT:     Head: Normocephalic and atraumatic.     Right Ear: Tympanic membrane, ear canal and external ear normal.     Left Ear: Tympanic membrane, ear  canal and external ear normal.     Ears:     Comments: No rashes in or around ears    Nose: Nose normal.  Eyes:     Extraocular Movements: Extraocular movements intact.     Conjunctiva/sclera: Conjunctivae normal.     Pupils: Pupils are equal, round, and reactive to light.  Cardiovascular:     Rate and Rhythm: Normal rate and regular rhythm.     Heart sounds: No murmur heard.    Comments: No carotid bruit Pulmonary:     Effort: Pulmonary effort is normal. No respiratory distress.     Breath sounds: Normal breath sounds.  Musculoskeletal:     Cervical back: Normal range of motion and neck supple.     Right lower leg: No edema.     Left lower leg: No edema.  Skin:    General: Skin is warm and dry.  Neurological:      Mental Status: She is alert.     Comments: NIHSS Exam  Level of Consciousness: Alert  LOC Questions: Answers Month and Age Correctly  LOC Commands: Opens and Closes Eyes and Hands on command  Best Gaze: Horizontal ocular movements intact  Visual Fields: No visual field loss  Facial Palsy: Right-sided facial droop L Upper Extremity Motor: No drift after 10 seconds  R Upper Extremity Motor: Drift but does not hit stretcher L Lower extremity Motor: No drift after 5 seconds  R Lower extremity Motor: No drift after 5 seconds  Ataxia: Absent  Sensory: Intact sensation to light touch on face, arms, trunk, and legs bilaterally  Best Language: No aphasia  Dysarthria: No dysarthria  Neglect: No visual or sensory neglect    Psychiatric:        Mood and Affect: Mood normal.     ED Results / Procedures / Treatments   Labs (all labs ordered are listed, but only abnormal results are displayed) Labs Reviewed - No data to display  EKG None  Radiology No results found.  Procedures Procedures  {Document cardiac monitor, telemetry assessment procedure when appropriate:1}  Medications Ordered in ED Medications - No data to display  ED Course/ Medical Decision Making/ A&P   {   Click here for ABCD2, HEART and other calculatorsREFRESH Note before signing :1}                              Medical Decision Making Amount and/or Complexity of Data Reviewed Labs: ordered. Radiology: ordered.   ***  {Document critical care time when appropriate:1} {Document review of labs and clinical decision tools ie heart score, Chads2Vasc2 etc:1}  {Document your independent review of radiology images, and any outside records:1} {Document your discussion with family members, caretakers, and with consultants:1} {Document social determinants of health affecting pt's care:1} {Document your decision making why or why not admission, treatments were needed:1} Final Clinical Impression(s) / ED  Diagnoses Final diagnoses:  None    Rx / DC Orders ED Discharge Orders     None

## 2023-07-02 NOTE — Progress Notes (Signed)
 Orthostatic Vitals performed with results as follows:  Position: Time:  Blood Pressure:  Pulse:  Lying  1812  202/56    89  Sitting  1815  225/68    100      Standing(0 min)1817  197/76    105  Standing (41min)1820  174/71    106   Dr Josiah Nigh made aware of completion. Patient complained of a "little" of dizziness (Dr Josiah Nigh aware).

## 2023-07-02 NOTE — Plan of Care (Signed)

## 2023-07-03 DIAGNOSIS — E1151 Type 2 diabetes mellitus with diabetic peripheral angiopathy without gangrene: Secondary | ICD-10-CM | POA: Diagnosis present

## 2023-07-03 DIAGNOSIS — I6329 Cerebral infarction due to unspecified occlusion or stenosis of other precerebral arteries: Secondary | ICD-10-CM

## 2023-07-03 DIAGNOSIS — Z79899 Other long term (current) drug therapy: Secondary | ICD-10-CM | POA: Diagnosis not present

## 2023-07-03 DIAGNOSIS — E118 Type 2 diabetes mellitus with unspecified complications: Secondary | ICD-10-CM

## 2023-07-03 DIAGNOSIS — Z7902 Long term (current) use of antithrombotics/antiplatelets: Secondary | ICD-10-CM | POA: Diagnosis not present

## 2023-07-03 DIAGNOSIS — Z7982 Long term (current) use of aspirin: Secondary | ICD-10-CM | POA: Diagnosis not present

## 2023-07-03 DIAGNOSIS — I639 Cerebral infarction, unspecified: Secondary | ICD-10-CM | POA: Diagnosis present

## 2023-07-03 DIAGNOSIS — E1165 Type 2 diabetes mellitus with hyperglycemia: Secondary | ICD-10-CM | POA: Diagnosis present

## 2023-07-03 DIAGNOSIS — I6602 Occlusion and stenosis of left middle cerebral artery: Secondary | ICD-10-CM | POA: Diagnosis present

## 2023-07-03 DIAGNOSIS — Z79890 Hormone replacement therapy (postmenopausal): Secondary | ICD-10-CM | POA: Diagnosis not present

## 2023-07-03 DIAGNOSIS — R29701 NIHSS score 1: Secondary | ICD-10-CM | POA: Diagnosis present

## 2023-07-03 DIAGNOSIS — Z91010 Allergy to peanuts: Secondary | ICD-10-CM | POA: Diagnosis not present

## 2023-07-03 DIAGNOSIS — I6502 Occlusion and stenosis of left vertebral artery: Secondary | ICD-10-CM | POA: Diagnosis present

## 2023-07-03 DIAGNOSIS — I6522 Occlusion and stenosis of left carotid artery: Secondary | ICD-10-CM | POA: Diagnosis present

## 2023-07-03 DIAGNOSIS — Z7984 Long term (current) use of oral hypoglycemic drugs: Secondary | ICD-10-CM | POA: Diagnosis not present

## 2023-07-03 DIAGNOSIS — E039 Hypothyroidism, unspecified: Secondary | ICD-10-CM | POA: Diagnosis present

## 2023-07-03 DIAGNOSIS — I6389 Other cerebral infarction: Secondary | ICD-10-CM | POA: Diagnosis present

## 2023-07-03 DIAGNOSIS — E785 Hyperlipidemia, unspecified: Secondary | ICD-10-CM | POA: Diagnosis present

## 2023-07-03 DIAGNOSIS — I959 Hypotension, unspecified: Secondary | ICD-10-CM | POA: Diagnosis present

## 2023-07-03 DIAGNOSIS — I6300 Cerebral infarction due to thrombosis of unspecified precerebral artery: Secondary | ICD-10-CM | POA: Diagnosis not present

## 2023-07-03 DIAGNOSIS — R29810 Facial weakness: Secondary | ICD-10-CM | POA: Diagnosis present

## 2023-07-03 DIAGNOSIS — E66811 Obesity, class 1: Secondary | ICD-10-CM | POA: Diagnosis present

## 2023-07-03 DIAGNOSIS — Z6833 Body mass index (BMI) 33.0-33.9, adult: Secondary | ICD-10-CM | POA: Diagnosis not present

## 2023-07-03 DIAGNOSIS — G936 Cerebral edema: Secondary | ICD-10-CM | POA: Diagnosis present

## 2023-07-03 DIAGNOSIS — J45909 Unspecified asthma, uncomplicated: Secondary | ICD-10-CM | POA: Diagnosis present

## 2023-07-03 DIAGNOSIS — I10 Essential (primary) hypertension: Secondary | ICD-10-CM | POA: Diagnosis present

## 2023-07-03 LAB — GLUCOSE, CAPILLARY
Glucose-Capillary: 161 mg/dL — ABNORMAL HIGH (ref 70–99)
Glucose-Capillary: 120 mg/dL — ABNORMAL HIGH (ref 70–99)
Glucose-Capillary: 124 mg/dL — ABNORMAL HIGH (ref 70–99)
Glucose-Capillary: 102 mg/dL — ABNORMAL HIGH (ref 70–99)

## 2023-07-03 LAB — MRSA NEXT GEN BY PCR, NASAL: MRSA by PCR Next Gen: NOT DETECTED

## 2023-07-03 LAB — T4, FREE: Free T4: 0.64 ng/dL (ref 0.61–1.12)

## 2023-07-03 LAB — HIV ANTIBODY (ROUTINE TESTING W REFLEX): HIV Screen 4th Generation wRfx: NONREACTIVE

## 2023-07-03 MED ORDER — CLONIDINE HCL 0.2 MG PO TABS
0.2000 mg | ORAL_TABLET | Freq: Two times a day (BID) | ORAL | Status: DC
  Administered 2023-07-03 – 2023-07-04 (×3): 0.2 mg via ORAL
  Filled 2023-07-03 (×3): qty 1

## 2023-07-03 MED ORDER — LEVOTHYROXINE SODIUM 100 MCG PO TABS
100.0000 ug | ORAL_TABLET | Freq: Every day | ORAL | Status: DC
  Administered 2023-07-04: 100 ug via ORAL
  Filled 2023-07-03: qty 1

## 2023-07-03 MED ORDER — AMLODIPINE BESYLATE 5 MG PO TABS
10.0000 mg | ORAL_TABLET | Freq: Every day | ORAL | Status: DC
  Administered 2023-07-03 – 2023-07-04 (×2): 10 mg via ORAL
  Filled 2023-07-03 (×2): qty 2

## 2023-07-03 NOTE — Plan of Care (Signed)
  Problem: Education: Goal: Knowledge of General Education information will improve Description: Including pain rating scale, medication(s)/side effects and non-pharmacologic comfort measures Outcome: Progressing   Problem: Clinical Measurements: Goal: Ability to maintain clinical measurements within normal limits will improve Outcome: Progressing Goal: Will remain free from infection Outcome: Progressing Goal: Respiratory complications will improve Outcome: Progressing Goal: Cardiovascular complication will be avoided Outcome: Progressing   Problem: Activity: Goal: Risk for activity intolerance will decrease Outcome: Progressing   Problem: Nutrition: Goal: Adequate nutrition will be maintained Outcome: Progressing   Problem: Coping: Goal: Level of anxiety will decrease Outcome: Progressing   Problem: Elimination: Goal: Will not experience complications related to bowel motility Outcome: Progressing Goal: Will not experience complications related to urinary retention Outcome: Progressing   Problem: Pain Managment: Goal: General experience of comfort will improve and/or be controlled Outcome: Progressing   Problem: Safety: Goal: Ability to remain free from injury will improve Outcome: Progressing

## 2023-07-03 NOTE — Progress Notes (Addendum)
 PROGRESS NOTE  Leslie Lindsey, is a 73 y.o. female, DOB - 08-01-1950, WUJ:[id number]  Admit date - 07/02/2023   Admitting Physician Hodan Wurtz Quintella Buck, MD  Outpatient Primary MD for the patient is Pcp, No  LOS - 0  Chief Complaint  Patient presents with   Dizziness       Brief Narrative:  73 y.o. 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 - Neurology consult appreciated -- LDL 131, HDL 100, total cholesterol 251, triglycerides 102 -- A1c 7.4 C/n telemetry monitor  - echocardiogram without intracardiac thrombus, EF 65 to 70%, grade 1 diastolic dysfunction noted, moderate LVH noted, no aortic stenosis, no mitral stenosis - Neuroconsult appreciated - PT eval appreciated recommends SNF rehab -- TSH 14.673 -C/n Aspirin  81 mg daily along with Plavix  75 mg daily for 90 days then after that STOP the Plavix   and continue ONLY Aspirin  81 mg daily indefinitely--for secondary stroke Prevention (Per The multicenter SAMMPRIS trial) # Maximize blood glucose control with insulin coverage (goal FS 60-180) #Please maintain euthermia. Tylenol prn for temp >100.4 # Frequent neuro checks -C/n Lipitor 40 mg daily, LDL Goal < 70   2)PAD--intracranial stenosis - CTA head and neck shows asymptomatic carotid stenosis on the left, moderate to severe intracranial stenosis of multiple arteries (Occluded Right Vertebral Artery, Severe stenosis of the Left Vertebral Artery V4 segment, with mildly diminished enhancement of the Basilar, Severe stenosis of the distal 3rd of the  Basilar Artery. This might directly correspond to the perforator infarct, Moderate Right and Severe Left ICA siphon due to bulky calcified plaque. And upstream 60-70% estimated stenosis of the left ICA origin due to bulky calcified plaque. 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 - Aspirin , Plavix  and Lipitor as above - Outpatient follow-up with vascular surgeon advised   3)HTN--- -BP remains persistently elevated with systolic blood pressure often above 230 mmHg -??  Some component of rebound hypertension as patient was previously on clonidine at home. - Restart clonidine 0.2 mg bid,  -- Add Amlodipine 10 mg daily -Hold lisinopril due to contrast exposure -HCTZ on hold as well -Continue IV hydralazine  as needed elevated BP   4)DM2-A1c 7.4 reflecting uncontrolled DM with hyperglycemia PTA -Hold metformin due to contrast exposure Use Novolog/Humalog Sliding scale insulin with Accu-Cheks/Fingersticks as ordered    5) class I obesity- -Low calorie diet, portion control and increase physical activity discussed with patient -Body mass index is 32.52 kg/m.   6)Hypothyroidism------ TSH 14.673 reflecting uncontrolled hypothyroidism, -- increase levothyroxine to 100 mcg daily from 88 mcg  levothyroxine   7)Asthma-no acute exacerbation, as needed bronchodilators as ordered   8) orthostatic dizziness and near syncope----repeat CT head without new additional findings or for previously noted findings of acute stroke   Status is: Inpatient   Disposition: The patient is from: Home              Anticipated d/c is to: SNF  Vs Home with [facility]  Anticipated d/c date is: 1 day              Patient currently is not medically stable to d/c. Barriers: Not Clinically Stable-   Code Status : -  Code Status: Full Code   Family Communication:   NA  (patient is alert, awake and coherent)  Discussed with patient Sister Altamease Asters at patient request  DVT Prophylaxis  :   - SCDs  heparin  injection 5,000 Units Start: 07/02/23 2200 SCDs Start: 07/02/23 1404 Place TED hose Start: 07/02/23 1404   Lab Results  Component Value Date   PLT 282 07/02/2023    Inpatient Medications  Scheduled Meds:  amLODipine  10 mg Oral Daily   aspirin  EC  81 mg Oral Q breakfast   aspirin   325 mg Oral Once   atorvastatin   40 mg Oral Daily   Chlorhexidine Gluconate Cloth  6 each Topical Q0600   cloNIDine  0.2 mg Oral BID   clopidogrel   75 mg Oral Daily   heparin   5,000 Units Subcutaneous Q8H   insulin aspart  0-5 Units Subcutaneous QHS   insulin aspart  0-9 Units Subcutaneous TID WC   levothyroxine  88 mcg Oral Daily   sodium chloride  flush  3 mL Intravenous Q12H   sodium chloride  flush  3 mL Intravenous Q12H   Continuous Infusions:  sodium chloride      PRN Meds:.sodium chloride , acetaminophen **OR** acetaminophen, bisacodyl, hydrALAZINE , LORazepam, meclizine , ondansetron  **OR** ondansetron  (ZOFRAN ) IV, polyethylene glycol, sodium chloride  flush, traZODone   Anti-infectives (From admission, onward)    None       Subjective: Leslie Lindsey today has no fevers, No chest pain,    - Patient continues to complain of nausea and dizziness - Orthostatic symptoms persist - Systolic blood pressure over 200 frequently over 230 mmHg--- requiring multiple doses of IV hydralazine  as needed - Ambulatory physical therapist, gait concerns persist  -Speech concerns persist, however no swallowing difficulty  Objective: Vitals:   07/03/23 1100 07/03/23 1234 07/03/23 1254 07/03/23 1300  BP: (!) 179/65  (!) 206/66 (!) 206/83  Pulse: 77  91 94  Resp: 15  17 13   Temp:  98.6 F (37 C)    TempSrc:  Oral    SpO2: 98%  99% 100%  Weight:      Height:        Intake/Output Summary (Last 24 hours) at 07/03/2023 1319 Last data filed at 07/03/2023 1250 Gross per  24 hour  Intake 245 ml  Output 350 ml  Net -105 ml   Filed Weights   07/02/23 0711 07/02/23 1452  Weight: 90.7 kg 91.4 kg    Physical Exam  Gen:- Awake Alert, in no acute distress HEENT:- [place].AT, No sclera icterus Neck-Supple Neck,No JVD,.  Lungs-  CTAB , fair symmetrical air movement CV- S1, S2 normal, regular  Abd-  +ve B.Sounds, Abd Soft, No tenderness,    Extremity/Skin:- No  edema, pedal pulses present  Psych-affect is appropriate, oriented x3 Neuro-speech deficits persist, -left-sided numbness of the face and left upper extremity mostly, no tremors   Data Reviewed: I have personally reviewed following labs and imaging studies  CBC: Recent Labs  Lab 07/02/23 0739  WBC 7.0  NEUTROABS 4.5  HGB 12.3  HCT 37.3  MCV 87.4  PLT 282   Basic Metabolic Panel: Recent Labs  Lab 07/02/23 0739  NA 138  K 3.6  CL 106  CO2 22  GLUCOSE 139*  BUN 15  CREATININE 0.76  CALCIUM  9.5  GFR: Estimated Creatinine Clearance: 74.5 mL/min (by C-G formula based on SCr of 0.76 mg/dL). Liver Function Tests: Recent Labs  Lab 07/02/23 0739  AST 29  ALT 30  ALKPHOS 52  BILITOT 0.8  PROT 7.6  ALBUMIN 3.7   HbA1C: Recent Labs    07/02/23 0739  HGBA1C 7.4*   Recent Results (from the past 240 hours)  MRSA Next Gen by PCR, Nasal     Status: None   Collection Time: 07/02/23  2:42 PM   Specimen: Nasal Mucosa; Nasal Swab  Result Value Ref Range Status   MRSA by PCR Next Gen NOT DETECTED NOT DETECTED Final    Comment: (NOTE) The GeneXpert MRSA Assay (FDA approved for NASAL specimens only), is one component of a comprehensive MRSA colonization surveillance program. It is not intended to diagnose MRSA infection nor to guide or monitor treatment for MRSA infections. Test performance is not FDA approved in patients less than 7 years old. Performed at [facility], [address] [place], [place] [address]     Radiology Studies: ECHOCARDIOGRAM COMPLETE BUBBLE STUDY Result  Date: 07/02/2023    ECHOCARDIOGRAM REPORT   Patient Name:   HEARTLY HAUPTMAN [facility] Date of Exam: 07/02/2023 Medical Rec #:  [id number]         Height:       65.0 in Accession #:    [id number]        Weight:       200.0 lb Date of Birth:  01-Sep-1950         BSA:          1.978 m Patient Age:    72 years          BP:           132/89 mmHg Patient Gender: F                 HR:           71 bpm. Exam Location:  [facility] Procedure: 2D Echo, Cardiac Doppler, Color Doppler, Saline Contrast Bubble Study            and Intracardiac Opacification Agent (Both Spectral and Color Flow            Doppler were utilized during procedure). Indications:    Stroke 434.91 / I63.9  History:        Patient has no prior history of Echocardiogram examinations.                 Risk Factors:Hypertension, Diabetes and Dyslipidemia.  Sonographer:    Denese Finn RCS Referring Phys: [id number] PRIYANKA O YADAV IMPRESSIONS  1. Left ventricular ejection fraction, by estimation, is 65 to 70%. The left ventricle has normal function. The left ventricle has no regional wall motion abnormalities. There is moderate left ventricular hypertrophy. Left ventricular diastolic parameters are consistent with Grade I diastolic dysfunction (impaired relaxation). Elevated left atrial pressure.  2. Right ventricular systolic function is normal. The right ventricular size is normal. There is normal pulmonary artery systolic pressure.  3. Left atrial size was moderately dilated.  4. The mitral valve is normal in structure. No evidence of mitral valve regurgitation. No evidence of mitral stenosis.  5. The tricuspid valve is abnormal.  6. The aortic valve is tricuspid. There is mild calcification of the aortic valve. There is mild thickening of the aortic valve. Aortic valve regurgitation is not visualized. No aortic stenosis is present.  7. The inferior vena cava is normal in  size with greater than 50% respiratory variability, suggesting right atrial pressure of 3 mmHg.  8.  Agitated saline contrast bubble study was negative, with no evidence of any interatrial shunt. FINDINGS  Left Ventricle: Left ventricular ejection fraction, by estimation, is 65 to 70%. The left ventricle has normal function. The left ventricle has no regional wall motion abnormalities. Definity contrast agent was given IV to delineate the left ventricular  endocardial borders. The left ventricular internal cavity size was normal in size. There is moderate left ventricular hypertrophy. Left ventricular diastolic parameters are consistent with Grade I diastolic dysfunction (impaired relaxation). Elevated left atrial pressure. Right Ventricle: The right ventricular size is normal. Right vetricular wall thickness was not well visualized. Right ventricular systolic function is normal. There is normal pulmonary artery systolic pressure. The tricuspid regurgitant velocity is 2.23 m/s, and with an assumed right atrial pressure of 3 mmHg, the estimated right ventricular systolic pressure is 22.9 mmHg. Left Atrium: Left atrial size was moderately dilated. Right Atrium: Right atrial size was normal in size. Pericardium: There is no evidence of pericardial effusion. Mitral Valve: The mitral valve is normal in structure. There is mild thickening of the mitral valve leaflet(s). There is mild calcification of the mitral valve leaflet(s). Mild mitral annular calcification. No evidence of mitral valve regurgitation. No evidence of mitral valve stenosis. Tricuspid Valve: The tricuspid valve is abnormal. Tricuspid valve regurgitation is mild . No evidence of tricuspid stenosis. Aortic Valve: The aortic valve is tricuspid. There is mild calcification of the aortic valve. There is mild thickening of the aortic valve. There is mild aortic valve annular calcification. Aortic valve regurgitation is not visualized. No aortic stenosis  is present. Aortic valve mean gradient measures 3.6 mmHg. Aortic valve peak gradient measures 7.5 mmHg.  Aortic valve area, by VTI measures 2.14 cm. Pulmonic Valve: The pulmonic valve was not well visualized. Pulmonic valve regurgitation is not visualized. No evidence of pulmonic stenosis. Aorta: The aortic root is normal in size and structure and the ascending aorta was not well visualized. Venous: The inferior vena cava is normal in size with greater than 50% respiratory variability, suggesting right atrial pressure of 3 mmHg. IAS/Shunts: The interatrial septum was not well visualized. Agitated saline contrast was given intravenously to evaluate for intracardiac shunting. Agitated saline contrast bubble study was negative, with no evidence of any interatrial shunt.  LEFT VENTRICLE PLAX 2D LVIDd:         3.90 cm   Diastology LVIDs:         2.10 cm   LV e' medial:    5.22 cm/s LV PW:         1.40 cm   LV E/e' medial:  18.1 LV IVS:        1.30 cm   LV e' lateral:   4.90 cm/s LVOT diam:     1.90 cm   LV E/e' lateral: 19.3 LV SV:         60 LV SV Index:   31 LVOT Area:     2.84 cm  RIGHT VENTRICLE RV S prime:     12.60 cm/s TAPSE (M-mode): 2.2 cm LEFT ATRIUM              Index        RIGHT ATRIUM           Index LA diam:        4.00 cm  2.02 cm/m   RA Area:     13.40 cm  LA Vol (A2C):   75.9 ml  38.37 ml/m  RA Volume:   32.50 ml  16.43 ml/m LA Vol (A4C):   111.0 ml 56.11 ml/m LA Biplane Vol: 92.7 ml  46.86 ml/m  AORTIC VALVE AV Area (Vmax):    2.25 cm AV Area (Vmean):   2.28 cm AV Area (VTI):     2.14 cm AV Vmax:           137.26 cm/s AV Vmean:          89.027 cm/s AV VTI:            0.282 m AV Peak Grad:      7.5 mmHg AV Mean Grad:      3.6 mmHg LVOT Vmax:         109.00 cm/s LVOT Vmean:        71.600 cm/s LVOT VTI:          0.213 m LVOT/AV VTI ratio: 0.75  AORTA Ao Root diam: 3.10 cm MITRAL VALVE                TRICUSPID VALVE MV Area (PHT): 2.54 cm     TR Peak grad:   19.9 mmHg MV Decel Time: 299 msec     TR Vmax:        223.00 cm/s MV E velocity: 94.60 cm/s MV A velocity: 132.00 cm/s  SHUNTS MV E/A ratio:   0.72         Systemic VTI:  0.21 m                             Systemic Diam: 1.90 cm Armida Lander MD Electronically signed by Armida Lander MD Signature Date/Time: 07/02/2023/4:14:06 PM    Final    CT Head Wo Contrast Result Date: 07/02/2023 CLINICAL DATA:  Mental status change. Dizziness. Right-sided weakness. Acute infarct of the left paramedian pons. EXAM: CT HEAD WITHOUT CONTRAST TECHNIQUE: Contiguous axial images were obtained from the base of the skull through the vertex without intravenous contrast. RADIATION DOSE REDUCTION: This exam was performed according to the departmental dose-optimization program which includes automated exposure control, adjustment of the mA and/or kV according to patient size and/or use of iterative reconstruction technique. COMPARISON:  MR head without contrast 07/02/2023 at 8:05 a.m. CT angio head and neck 07/02/2023 at 8:52 a.m. FINDINGS: Brain: Left paracentral pontine nonhemorrhagic infarct is again seen. No significant expansion of the infarct or hemorrhage is present. The mild periventricular white matter hypoattenuation is again noted. The supratentorial structures are otherwise within normal limits. Deep brain nuclei are within normal limits. Insert normal ventricles No significant extraaxial fluid collection is present. The brainstem and cerebellum are within normal limits. Midline structures are within normal limits. Vascular: Atherosclerotic calcifications are present within the cavernous internal carotid arteries and at the proximal V4 segments bilaterally. No hyperdense vessel is present. Skull: Calvarium is intact. No focal lytic or blastic lesions are present. No significant extracranial soft tissue lesion is present. Sinuses/Orbits: The paranasal sinuses and mastoid air cells are clear. A remote left orbital blowout fracture is again noted. Globes and orbits are otherwise within normal limits. IMPRESSION: 1. Left paracentral pontine nonhemorrhagic infarct is  again seen. No significant expansion of the infarct or hemorrhage is present. 2. Stable mild periventricular white matter hypoattenuation. This likely reflects the sequela of chronic microvascular ischemia. 3. Remote left orbital blowout fracture. Electronically Signed   By: Marylouise Socks.D.  On: 07/02/2023 14:21   CT ANGIO HEAD NECK W WO CM Addendum Date: 07/02/2023 ADDENDUM REPORT: 07/02/2023 09:33 ADDENDUM: Study discussed by telephone with Dr. Shyrl Doyne on 07/02/2023 at 0926 hours. Electronically Signed   By: Marlise Simpers M.D.   On: 07/02/2023 09:33   Result Date: 07/02/2023 CLINICAL DATA:  73 year old female with dizziness, headache. Left pontine infarct, abnormal distal right vertebral artery flow void on MRI. EXAM: CT ANGIOGRAPHY HEAD AND NECK WITH AND WITHOUT CONTRAST TECHNIQUE: Multidetector CT imaging of the head and neck was performed using the standard protocol during bolus administration of intravenous contrast. Multiplanar CT image reconstructions and MIPs were obtained to evaluate the vascular anatomy. Carotid stenosis measurements (when applicable) are obtained utilizing NASCET criteria, using the distal internal carotid diameter as the denominator. RADIATION DOSE REDUCTION: This exam was performed according to the departmental dose-optimization program which includes automated exposure control, adjustment of the mA and/or kV according to patient size and/or use of iterative reconstruction technique. CONTRAST:  75mL OMNIPAQUE IOHEXOL 350 MG/ML SOLN COMPARISON:  Brain MRI today reported separately. FINDINGS: CT HEAD Brain: Left pontine infarct is hypodense on series 4, image 9. No new intracranial abnormality identified. Calvarium and skull base: Chronic bilateral lamina papyracea and orbital floor fractures. Calvarium intact. Paranasal sinuses: Chronic bilateral lamina papyracea and orbital floor fractures, otherwise well aerated. Orbits: Chronic bilateral lamina papyracea and orbital  floor fractures, otherwise negative orbit and scalp soft tissues. CTA NECK Skeleton: Carious dentition. Mild for age cervical spine degeneration. No acute osseous abnormality identified. Upper chest: Negative. Other neck: Diminutive or absent thyroid. Partially retropharyngeal carotid arteries more so the left. Aortic arch: 3 vessel arch.  Calcified aortic atherosclerosis. Right carotid system: Mild brachiocephalic artery soft plaque and tortuosity without stenosis. Tortuous right CCA. Bulky soft plaque medial right CCA on series 8, image 117 with 50 % stenosis with respect to the distal vessel. Combined soft and calcified plaque at the posterior right ICA origin with less than 50 % stenosis with respect to the distal vessel. Retropharyngeal course of tortuosity. Left carotid system: Tortuous proximal left CCA with mild soft plaque, no stenosis. Bulky calcified plaque at the left carotid bifurcation and ICA origin resulting in stenosis numerically estimated at 65-70 % with respect to the distal vessel (series 8, image 104). Retropharyngeal right ICA distal to that remains patent to the skull base. Vertebral arteries: Mild plaque at the right subclavian artery origin. Right vertebral artery origin is occluded, with only minimal thread-like reconstituted the 2 enhancement which is intermittently noted. Thread-like V3 enhancement. Proximal left subclavian artery soft plaque without stenosis. Left vertebral artery origin mild to moderate stenosis due to soft plaque series 11, image 182. Patent left vertebral artery with tortuosity in the neck, no additional stenosis to the skull base. Reconstitution CTA HEAD Posterior circulation: Occluded right vertebral V4 segment, superimposed calcified plaque. Proximal left V4 vertebral segment with bulky calcified plaque and string sign stenosis series 11, image 145, but the vessel remains patent, although also moderately stenotic at the left vertebrobasilar origin. Diminished  basilar artery enhancement relative to the anterior circulation. But the basilar remains patent. However, there is severe stenosis of the distal 3rd of the basilar series 14, image 25. Basilar tip, SCA and PCA origins are patent although hypoenhancing somewhat. Bilateral PCA branches are mildly irregular, patent. Anterior circulation: Both ICA siphons are heavily calcified in the cavernous segments. Both ICA siphons remain patent but there is severe left (series 11, image 94) and moderate right siphon  stenosis. Patent carotid termini, MCA and ACA origins. Moderate stenosis left ACA A1. 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 Bilateral ACA branches otherwise patent and mildly irregular. Left MCA M1 segment is patent although with mild to moderate mid and distal M1 stenosis as on series 14, image 21. Left MCA bifurcation and branches are patent with mild irregularity. Right MCA M1 segment is patent with mild irregularity. Right MCA branches are patent with mild irregularity. Venous sinuses: Patent. Anatomic variants: Effectively dominant left vertebral artery. Review of the MIP images confirms the above findings IMPRESSION: 1. Positive for acute stroke related: - Occluded Right Vertebral Artery, both at the origin and the V4 segment with only thread-like reconstitution in between. - Severe stenosis of the Left Vertebral Artery V4 segment, with mildly diminished enhancement of the Basilar. - Severe stenosis of the distal 3rd of the Basilar Artery. This might directly correspond to the perforator infarct. 2. Positive also for up to Moderate Right and Severe Left ICA siphon due to bulky calcified plaque. And upstream 60-70% estimated stenosis of the left ICA origin due to bulky calcified plaque. 50% stenosis Right CCA due to bulky calcified plaque. 3. 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 4. Up to Moderate stenosis of the  Left MCA M1 Left ACA A1 segments. 5. Expected CT appearance of left pontine infarct. No new intracranial abnormality identified. Aortic Atherosclerosis (ICD10-I70.0). Electronically Signed: By: Marlise Simpers M.D. On: 07/02/2023 09:21   MR BRAIN WO CONTRAST Result Date: 07/02/2023 CLINICAL DATA:  73 year old female with dizziness, headache, right side weakness. Left hand numbness. EXAM: MRI HEAD WITHOUT CONTRAST TECHNIQUE: Multiplanar, multiecho pulse sequences of the brain and surrounding structures were obtained without intravenous contrast. COMPARISON:  None Available. FINDINGS: Brain: Large patchy and linear acute infarct of the left paracentral pons, slightly larger than 2 cm AP (series 3, image 17). Confluent T2 and FLAIR hyperintensity there. Confluent T1 hypointensity suggesting this is early subacute. No significant pontine expansion. No evidence of hemorrhage. No other diffusion restriction identified. Normal for age brain volume. No midline shift, mass effect, evidence of mass lesion, ventriculomegaly, extra-axial collection or acute intracranial hemorrhage. Cervicomedullary junction and pituitary are within normal limits. Mild for age scattered white matter T2 and FLAIR hyperintensity, including some right corona radiata involvement, nonspecific combined periventricular and subcortical involvement. No chronic cortical encephalomalacia identified. No chronic cerebral blood products on SWI. Deep gray nuclei and cerebellum appear negative. Vascular: Abnormal distal right vertebral artery flow void on series 8, image 4. Distal left vertebral artery, other Major intracranial vascular flow voids are preserved. Skull and upper cervical spine: Negative for age visible cervical spine. Visualized bone marrow signal is within normal limits. Sinuses/Orbits: Evidence of bilateral lamina papyracea fractures on series 8, image 9, but otherwise negative orbits soft tissues. Otherwise paranasal sinuses and mastoids are stable  and well aerated. Other: Grossly normal visible internal auditory structures. Persistent susceptibility artifact along the right lateral head, scalp, etiology and significance unclear. IMPRESSION: 1. 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 Confluent cytotoxic edema with no hemorrhage or mass effect. 2. No other acute intracranial abnormality, but abnormal flow void of the distal right vertebral artery might indicate hemodynamically significant stenosis or occlusion. A CTA is pending at this time. 3. Mild for age nonspecific white matter signal changes, probably due to small vessel disease. Electronically Signed   By: Marlise Simpers  M.D.   On: 07/02/2023 08:34   Scheduled Meds:  amLODipine  10 mg Oral Daily   aspirin  EC  81 mg Oral Q breakfast   aspirin   325 mg Oral Once   atorvastatin   40 mg Oral Daily   Chlorhexidine Gluconate Cloth  6 each Topical Q0600   cloNIDine  0.2 mg Oral BID   clopidogrel   75 mg Oral Daily   heparin   5,000 Units Subcutaneous Q8H   insulin aspart  0-5 Units Subcutaneous QHS   insulin aspart  0-9 Units Subcutaneous TID WC   levothyroxine  88 mcg Oral Daily   sodium chloride  flush  3 mL Intravenous Q12H   sodium chloride  flush  3 mL Intravenous Q12H   Continuous Infusions:  sodium chloride        LOS: 0 days    Colin Dawley M.D on 07/03/2023 at 1:19 PM  Go to [email or web address] - for contact info  [facility] - Office  [phone]  If 7PM-7AM, please contact night-coverage [email or web address] 07/03/2023, 1:19 PM

## 2023-07-03 NOTE — Evaluation (Signed)
 Physical Therapy Evaluation Patient Details Name: Leslie Lindsey MRN: [id number] DOB: 1950-12-30 Today's Date: 07/03/2023  History of Present Illness  Leslie Lindsey is a 73 y.o. female with medical history significant of Asthma, DM2, uncontrolled HTN, obesity, and hypothyroidism who presents to the ED with complaints of dizziness, left-sided facial numbness headaches for about a week or so.   -  While in the ED BP was elevated and patient received IV hydralazine  and labetalol  in the ED apparently--subsequently Pt stood to get to [facility], while sitting on BSC, but became dizzy, diaphoretic, began to slur her words and was pre syncopal. Pt noted to be hypotensive 98/42, pt placed back in bed by NT and 2 RNs. ---EDP gave IV fluid boluses and BP recovered    Clinical Impression  Patient sitting up in chair assisted there by nursing on therapist arrival.  She is sleeping but rouses fairly easily on greeting from PT.  Patient is agreeable to assessment.  Patient performs sit to stand multiple times with min A; needs a couple of attempts to push up to standing.  Once standing; she needs Hand held assistance to take a few steps but reports she is dizzy and takes a couple of missteps.  She uses a cane intermittently at baseline but would benefit from a walker for safety.  Patient lethargic throughout assessment but able to answer questions appropriately and follow commands.  patient left in chair with call button in reach and nursing aware of mobility status. Patient will benefit from continued skilled therapy services during the remainder of her hospital stay and at the next recommended venue of care to address deficits and promote return to optimal function.           If plan is discharge home, recommend the following: A lot of help with walking and/or transfers;Help with stairs or ramp for entrance;A lot of help with bathing/dressing/bathroom   Can travel by private vehicle   Yes    Equipment  Recommendations Rolling walker (2 wheels)  Recommendations for Other Services       Functional Status Assessment Patient has had a recent decline in their functional status and demonstrates the ability to make significant improvements in function in a reasonable and predictable amount of time.     Precautions / Restrictions Precautions Precautions: Fall Recall of Precautions/Restrictions: Intact Restrictions Weight Bearing Restrictions Per Provider Order: No      Mobility  Bed Mobility               General bed mobility comments: assisted up to chair by nursing Patient Response: Cooperative (lethargic)  Transfers Overall transfer level: Needs assistance Equipment used: 1 person hand held assist               General transfer comment: sit to stand takes a couple of attempts; uses left hand to push up to standing    Ambulation/Gait Ambulation/Gait assistance: Min assist Gait Distance (Feet): 5 Feet Assistive device: 1 person hand held assist Gait Pattern/deviations: Decreased step length - right, Decreased step length - left, Staggering right       General Gait Details: slow; reports dizziness on standing  Stairs            Wheelchair Mobility     Tilt Bed Tilt Bed Patient Response: Cooperative (lethargic)  Modified Rankin (Stroke Patients Only)       Balance Overall balance assessment: Needs assistance Sitting-balance support: Feet supported, Single extremity supported Sitting balance-Leahy Scale: Good Sitting balance -  Comments: fair to good sitting balance on the edge of the chair   Standing balance support: Single extremity supported, During functional activity Standing balance-Leahy Scale: Fair Standing balance comment: fair standing  balance; reports dizziness and takes a couple of missteps                             Pertinent Vitals/Pain Pain Assessment Pain Assessment: No/denies pain    Home Living Family/patient  expects to be discharged to:: Private residence Living Arrangements: Children Available Help at Discharge: Family;Available PRN/intermittently Type of Home: House Home Access: Stairs to enter Entrance Stairs-Rails: Right Entrance Stairs-Number of Steps: 5   Home Layout: One level Home Equipment: Cane - single point      Prior Function Prior Level of Function : Independent/Modified Independent             Mobility Comments: used a cane intermittently       Extremity/Trunk Assessment   Upper Extremity Assessment Upper Extremity Assessment: Defer to OT evaluation    Lower Extremity Assessment Lower Extremity Assessment: Generalized weakness    Cervical / Trunk Assessment Cervical / Trunk Assessment: Normal  Communication   Communication Communication: No apparent difficulties    Cognition Arousal: Lethargic Behavior During Therapy: WFL for tasks assessed/performed   PT - Cognitive impairments: No apparent impairments                       PT - Cognition Comments: able to answer questions and follow commands but was sleeping on arrival and quickly went back to sleep after assessment; closed her eyes intermittently during questioning Following commands: Intact       Cueing       General Comments      Exercises     Assessment/Plan    PT Assessment Patient needs continued PT services  PT Problem List Decreased strength;Decreased activity tolerance;Decreased balance;Decreased mobility       PT Treatment Interventions DME instruction;Gait training;Functional mobility training;Therapeutic activities;Therapeutic exercise;Balance training    PT Goals (Current goals can be found in the Care Plan section)  Acute Rehab PT Goals Patient Stated Goal: return home after rehab PT Goal Formulation: With patient Time For Goal Achievement: 07/17/23 Potential to Achieve Goals: Good    Frequency Min 3X/week     Co-evaluation               AM-PAC  PT "6 Clicks" Mobility  Outcome Measure Help needed turning from your back to your side while in a flat bed without using bedrails?: A Little Help needed moving from lying on your back to sitting on the side of a flat bed without using bedrails?: A Little Help needed moving to and from a bed to a chair (including a wheelchair)?: A Little Help needed standing up from a chair using your arms (e.g., wheelchair or bedside chair)?: A Lot Help needed to walk in hospital room?: A Lot Help needed climbing 3-5 steps with a railing? : A Lot 6 Click Score: 15    End of Session Equipment Utilized During Treatment: Gait belt Activity Tolerance: Patient limited by lethargy Patient left: in chair;with call bell/phone within reach Nurse Communication: Mobility status PT Visit Diagnosis: Unsteadiness on feet (R26.81);Other abnormalities of gait and mobility (R26.89);Muscle weakness (generalized) (M62.81)    Time: 1610-9604 PT Time Calculation (min) (ACUTE ONLY): 22 min   Charges:   PT Evaluation $PT Eval Moderate Complexity: 1 Mod  PT General Charges $$ ACUTE PT VISIT: 1 Visit         10:25 AM, 07/03/23 Leilene Diprima Small Leslie Lindsey MPT [facility] [place] [phone]

## 2023-07-03 NOTE — Plan of Care (Signed)
  Problem: Acute Rehab PT Goals(only PT should resolve) Goal: Pt Will Go Supine/Side To Sit Outcome: Progressing Flowsheets (Taken 07/03/2023 1026) Pt will go Supine/Side to Sit: with supervision Goal: Patient Will Transfer Sit To/From Stand Outcome: Progressing Flowsheets (Taken 07/03/2023 1026) Patient will transfer sit to/from stand: with contact guard assist Goal: Pt Will Transfer Bed To Chair/Chair To Bed Outcome: Progressing Flowsheets (Taken 07/03/2023 1026) Pt will Transfer Bed to Chair/Chair to Bed: with contact guard assist Goal: Pt Will Ambulate Outcome: Progressing Flowsheets (Taken 07/03/2023 1026) Pt will Ambulate:  25 feet  with rolling walker  with contact guard assist

## 2023-07-04 DIAGNOSIS — E039 Hypothyroidism, unspecified: Secondary | ICD-10-CM | POA: Diagnosis not present

## 2023-07-04 DIAGNOSIS — E118 Type 2 diabetes mellitus with unspecified complications: Secondary | ICD-10-CM | POA: Diagnosis not present

## 2023-07-04 DIAGNOSIS — I6300 Cerebral infarction due to thrombosis of unspecified precerebral artery: Secondary | ICD-10-CM

## 2023-07-04 LAB — GLUCOSE, CAPILLARY
Glucose-Capillary: 160 mg/dL — ABNORMAL HIGH (ref 70–99)
Glucose-Capillary: 103 mg/dL — ABNORMAL HIGH (ref 70–99)

## 2023-07-04 MED ORDER — METFORMIN HCL 500 MG PO TABS
500.0000 mg | ORAL_TABLET | Freq: Two times a day (BID) | ORAL | 5 refills | Status: DC
Start: 2023-07-04 — End: 2023-09-25

## 2023-07-04 MED ORDER — AMLODIPINE BESYLATE 10 MG PO TABS: 10.0000 mg | ORAL_TABLET | Freq: Every day | ORAL | 5 refills | Status: DC

## 2023-07-04 MED ORDER — CARVEDILOL 6.25 MG PO TABS: 6.2500 mg | ORAL_TABLET | Freq: Two times a day (BID) | ORAL | 11 refills | Status: DC

## 2023-07-04 MED ORDER — LEVOTHYROXINE SODIUM 100 MCG PO TABS: 100.0000 ug | ORAL_TABLET | Freq: Every day | ORAL | 5 refills | Status: DC

## 2023-07-04 MED ORDER — ASPIRIN 81 MG PO TBEC: 81.0000 mg | DELAYED_RELEASE_TABLET | Freq: Every day | ORAL | 12 refills | Status: DC

## 2023-07-04 MED ORDER — ACETAMINOPHEN 325 MG PO TABS: 650.0000 mg | ORAL_TABLET | Freq: Four times a day (QID) | ORAL | Status: AC | PRN

## 2023-07-04 MED ORDER — LISINOPRIL 40 MG PO TABS: 40.0000 mg | ORAL_TABLET | Freq: Every day | ORAL | 5 refills | Status: DC

## 2023-07-04 MED ORDER — ATORVASTATIN CALCIUM 40 MG PO TABS
40.0000 mg | ORAL_TABLET | Freq: Every day | ORAL | 5 refills | Status: DC
Start: 2023-07-05 — End: 2024-01-05

## 2023-07-04 MED ORDER — CLONIDINE HCL 0.2 MG PO TABS: 0.2000 mg | ORAL_TABLET | Freq: Two times a day (BID) | ORAL | 5 refills | Status: DC

## 2023-07-04 MED ORDER — CLOPIDOGREL BISULFATE 75 MG PO TABS: 75.0000 mg | ORAL_TABLET | Freq: Every day | ORAL | 2 refills | Status: DC

## 2023-07-04 MED ORDER — MECLIZINE HCL 25 MG PO TABS: 25.0000 mg | ORAL_TABLET | Freq: Three times a day (TID) | ORAL | 0 refills | Status: DC | PRN

## 2023-07-04 MED ORDER — ALBUTEROL SULFATE HFA 108 (90 BASE) MCG/ACT IN AERS: 2.0000 | INHALATION_SPRAY | RESPIRATORY_TRACT | 1 refills | Status: DC | PRN

## 2023-07-04 MED ORDER — HYDRALAZINE HCL 25 MG PO TABS: 25.0000 mg | ORAL_TABLET | Freq: Three times a day (TID) | ORAL | 5 refills | Status: DC

## 2023-07-04 NOTE — Discharge Summary (Signed)
 Leslie Lindsey, is a 73 y.o. female  DOB 1950/08/22  MRN [id number].  Admission date:  07/02/2023  Admitting Physician  Colin Dawley, MD  Discharge Date:  07/04/2023   Primary MD  Pcp, No  Recommendations for primary care physician for things to follow:  1)Avoid ibuprofen/Advil/Aleve/Motrin/Goody Powders/Naproxen/BC powders/Meloxicam/Diclofenac/Indomethacin and other Nonsteroidal anti-inflammatory medications as these will make you more likely to bleed and can cause stomach ulcers, can also cause Kidney problems.   2)Watch for bleeding while on Blood Thinners--watch for blood in your stool which can make your stool black, maroon, mahogany or red---, blood in your urine which can make your urine pink or red, nosebleeds , also watch for possible bruising -You are taking aspirin  and Plavix -- blood thinners--- be careful to avoid injury or falls  3)Please check your Blood Pressure 3-4 times a week and keep a blood pressure diary and take these blood pressure readings to your primary care physician so that your blood pressure medications can be adjusted further  4)Very Low-salt diet advised---Less than 2 gm of Sodium per day advised----ok to use Mrs DASH salt substitute instead of Salt  Admission Diagnosis  Stroke [facility]) [I63.9] Acute ischemic stroke (HCC) [I63.9]   Discharge Diagnosis  Stroke [facility]) [I63.9] Acute ischemic stroke (HCC) [I63.9]    Principal Problem:   Acute Stroke ---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 Active Problems:   Intracranial vascular stenosis (multiple Vessels)   HTN (hypertension)   HLD (hyperlipidemia)   Hypothyroidism   Diabetes mellitus type 2 with complications (HCC)      Past Medical History:  Diagnosis Date   Asthma    Diabetes mellitus without complication (HCC)    Hypertension    Hyperthyroidism    PONV (postoperative nausea and vomiting)     Shortness of breath     Past Surgical History:  Procedure Laterality Date   EYE SURGERY     30 years ago   HYSTEROSCOPY WITH D & C  11/11/2010   Procedure: DILATATION AND CURETTAGE (D&C) /HYSTEROSCOPY;  Surgeon: Albino Hum, MD;  Location: [facility];  Service: Gynecology;  Laterality: N/A;  WITH REMOVAL ENDOMETRIAL POLYP       HPI  from the history and physical done on the day of admission:  HPI: Leslie Lindsey is a 73 y.o. female with medical history significant of Asthma, DM2, uncontrolled HTN, obesity, and hypothyroidism who presents to the ED with complaints of dizziness, left-sided facial numbness headaches for about a week or so.  - While in the ED BP was elevated and patient received IV hydralazine  and labetalol  in the ED apparently--subsequently Pt stood to get to [facility], while sitting on BSC, but became dizzy, diaphoretic, began to slur her words and was pre syncopal. Pt noted to be hypotensive 98/42, pt placed back in bed by NT and 2 RNs. ---EDP gave IV fluid boluses and BP recovered -- No fever  Or chills  No chest pain no palpitations no dyspnea no pleuritic symptoms -Additional history obtained from  patient Sister Altamease Asters at bedside   In the ED CBC WNL, LFTs WNL creatinine 0.76 electrolytes WNL - LDL 131, HDL 100, total cholesterol 251, triglycerides 102 -- A1c 7.4 - UDS negative, UA is not suggestive of UTI, blood alcohol level negative - CTA head and neck with asymptomatic carotid stenosis on the left, moderate to severe intracranial stenosis of multiple arteries (Occluded Right Vertebral Artery, Severe stenosis of the Left Vertebral Artery V4 segment, with mildly diminished enhancement of the Basilar, Severe stenosis of the distal 3rd of the Basilar Artery. This might directly correspond to the perforator infarct, Moderate Right and Severe Left ICA siphon due to bulky calcified plaque. And upstream 60-70% estimated stenosis of the left ICA origin due to bulky calcified  plaque. 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 Confluent cytotoxic edema with no hemorrhage or mass effect.   Review of Systems: As mentioned in the history of present illness. All other systems reviewed and are negative.    Hospital Course:   Brief Narrative:  73 y.o. 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 - Neurology consult appreciated -- LDL 131, HDL 100, total cholesterol 251, triglycerides 102 -- A1c 7.4 C/n telemetry monitor  - echocardiogram without intracardiac thrombus, EF 65 to 70%, grade 1 diastolic dysfunction noted, moderate LVH noted, no aortic stenosis, no mitral stenosis - Neuroconsult appreciated - PT eval appreciated recommends SNF rehab, patient declines SNF rehab patient wants to go home with home health services and rolling walker -- TSH 14.673 -C/n Aspirin  81 mg daily along with Plavix  75 mg daily for 90 days then after that STOP the Plavix   and continue ONLY Aspirin  81 mg daily indefinitely--for secondary stroke Prevention (Per The multicenter SAMMPRIS trial) -C/n Lipitor 40 mg daily, LDL Goal < 70   2)PAD--intracranial stenosis - CTA head and neck shows asymptomatic carotid stenosis on the left, moderate to severe intracranial stenosis of multiple arteries (Occluded Right Vertebral Artery,  Severe stenosis of the Left Vertebral Artery V4 segment, with mildly diminished enhancement of the Basilar, Severe stenosis of the distal 3rd of the Basilar Artery. This might directly correspond to the perforator infarct, Moderate Right and Severe Left ICA siphon due to bulky calcified plaque. And upstream 60-70% estimated stenosis of the left ICA origin due to bulky calcified plaque. 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 - c/n Aspirin , Plavix  and Lipitor as above - Outpatient follow-up with vascular surgeon advised   3)HTN--- malignant hypertension, difficult to control BP - Restart clonidine 0.2 mg bid to avoid rebound hypertension -- Added Amlodipine 10 mg daily, Coreg and hydralazine  -Okay to restart lisinopril - Keep BP diary and follow-up with PCP for further adjustments of BP meds   4)DM2-A1c 7.4 reflecting uncontrolled DM with hyperglycemia PTA - Increase metformin  to 500 twice daily for better glycemic control   5) class I obesity- -Low calorie diet, portion control and increase physical activity discussed with patient -Body mass index is 32.52 kg/m.   6)Hypothyroidism------ TSH 14.673 reflecting uncontrolled hypothyroidism, -- increase levothyroxine to 100 mcg daily from 88 mcg  levothyroxine   7)Asthma-no acute exacerbation, as needed bronchodilators as ordered   8) orthostatic dizziness and near syncope----repeat CT head without new additional findings or for previously noted findings of acute stroke  Disposition: The patient is from: Home              Anticipated d/c is to:  Home with HH                Code Status : -  Code Status: Full Code    Family Communication:   NA (patient is alert, awake and coherent)  Discussed with patient Sister Altamease Asters at patient request  Discharge Condition: Stable  Follow UP--outpatient  follow-up with vascular surgery and neurology advised   Consults obtained -neurology  Diet and Activity recommendation:  As advised  Discharge Instructions     Discharge Instructions     Ambulatory referral to Neurology   Complete by: As directed    An appointment is requested in approximately: 8-12 weeks   Ambulatory referral to Vascular Surgery   Complete by: As directed    Intracranial stenosis   Ambulatory referral to Vascular Surgery   Complete by: As directed    Intracranial stenosis   Call MD for:  difficulty breathing, headache or visual disturbances   Complete by: As directed    Call MD for:  persistant dizziness or light-headedness   Complete by: As directed    Call MD for:  persistant nausea and vomiting   Complete by: As directed    Call MD for:  temperature >100.4   Complete by: As directed    Diet - low sodium heart healthy   Complete by: As directed    Discharge instructions   Complete by: As directed    1)Avoid ibuprofen/Advil/Aleve/Motrin/Goody Powders/Naproxen/BC powders/Meloxicam/Diclofenac/Indomethacin and other Nonsteroidal anti-inflammatory medications as these will make you more likely to bleed and can cause stomach ulcers, can also cause Kidney problems.   2)Watch for bleeding while on Blood Thinners--watch for blood in your stool which can make your stool black, maroon, mahogany or red---, blood in your urine which can make your urine pink or red, nosebleeds , also watch for possible bruising -You are taking aspirin  and Plavix -- blood thinners--- be careful to avoid injury or falls  3)Please check your Blood Pressure 3-4 times a week and keep a blood pressure diary and take these blood pressure readings to your primary care physician so that your blood pressure medications can be adjusted further  4)Very Low-salt diet advised---Less than 2 gm of Sodium per day advised----ok to use Mrs DASH salt substitute instead of Salt   Increase activity slowly    Complete by: As directed         Discharge Medications     Allergies as of 07/04/2023       Reactions   Peanut-containing Drug Products Anaphylaxis        Medication List     STOP taking these medications    Potassium Chloride ER 20 MEQ Tbcr       TAKE these medications    acetaminophen 325 MG tablet Commonly known as: TYLENOL Take 2 tablets (650 mg total) by mouth every 6 (six) hours as  needed for mild pain (pain score 1-3) or headache (or Fever >/= 101). What changed:  medication strength how much to take reasons to take this additional instructions   albuterol 108 (90 Base) MCG/ACT inhaler Commonly known as: VENTOLIN HFA Inhale 2 puffs into the lungs every 4 (four) hours as needed for wheezing or shortness of breath.   amLODipine 10 MG tablet Commonly known as: NORVASC Take 1 tablet (10 mg total) by mouth daily. Start taking on: Jul 05, 2023   aspirin  EC 81 MG tablet Take 1 tablet (81 mg total) by mouth daily with breakfast. -take Aspirin  81 mg daily along with Plavix  75 mg daily for 90 days then after that STOP the Plavix   and continue ONLY Aspirin  81 mg daily indefinitely-- Start taking on: Jul 05, 2023   atorvastatin  40 MG tablet Commonly known as: LIPITOR Take 1 tablet (40 mg total) by mouth daily. Start taking on: Jul 05, 2023 What changed:  medication strength how much to take   carvedilol 6.25 MG tablet Commonly known as: Coreg Take 1 tablet (6.25 mg total) by mouth 2 (two) times daily.   cloNIDine 0.2 MG tablet Commonly known as: CATAPRES Take 1 tablet (0.2 mg total) by mouth 2 (two) times daily.   clopidogrel  75 MG tablet Commonly known as: PLAVIX  Take 1 tablet (75 mg total) by mouth daily. -take Aspirin  81 mg daily along with Plavix  75 mg daily for 90 days then after that STOP the Plavix   and continue ONLY Aspirin  81 mg daily indefinitely-- Start taking on: Jul 05, 2023   diphenhydrAMINE 25 MG tablet Commonly known as: SOMINEX Take 25  mg by mouth at bedtime as needed for allergies or itching. For allergies   hydrALAZINE  25 MG tablet Commonly known as: APRESOLINE  Take 1 tablet (25 mg total) by mouth 3 (three) times daily.   hydrochlorothiazide 12.5 MG tablet Commonly known as: HYDRODIURIL Take 12.5 mg by mouth daily as needed (Swelling).   levothyroxine 100 MCG tablet Commonly known as: SYNTHROID Take 1 tablet (100 mcg total) by mouth daily at 6 (six) AM. Start taking on: Jul 05, 2023 What changed:  medication strength how much to take when to take this   lisinopril 40 MG tablet Commonly known as: ZESTRIL Take 1 tablet (40 mg total) by mouth daily.   LORazepam 1 MG tablet Commonly known as: ATIVAN Take 1 mg by mouth 3 (three) times daily as needed for anxiety.   meclizine  25 MG tablet Commonly known as: ANTIVERT  Take 1 tablet (25 mg total) by mouth 3 (three) times daily as needed for dizziness (vertigo).   metFORMIN 500 MG tablet Commonly known as: GLUCOPHAGE Take 1 tablet (500 mg total) by mouth 2 (two) times daily with a meal. What changed: when to take this               Durable Medical Equipment  (From admission, onward)           Start     Ordered   07/04/23 1109  For home use only DME 4 wheeled rolling walker with seat  Once       Question:  Patient needs a walker to treat with the following condition  Answer:  Acute stroke due to ischemia [facility])   07/04/23 1110   07/03/23 1028  For home use only DME Walker rolling  Once       Question Answer Comment  Walker: With 5 Inch Wheels   Patient needs a walker to  treat with the following condition Abnormality of gait and mobility      07/03/23 1028            Major procedures and Radiology Reports - PLEASE review detailed and final reports for all details, in brief -   ECHOCARDIOGRAM COMPLETE BUBBLE STUDY Result Date: 07/02/2023    ECHOCARDIOGRAM REPORT   Patient Name:   Leslie Lindsey [facility] Date of Exam: 07/02/2023 Medical Rec #:   [id number]         Height:       65.0 in Accession #:    [id number]        Weight:       200.0 lb Date of Birth:  05/07/1950         BSA:          1.978 m Patient Age:    72 years          BP:           132/89 mmHg Patient Gender: F                 HR:           71 bpm. Exam Location:  [facility] Procedure: 2D Echo, Cardiac Doppler, Color Doppler, Saline Contrast Bubble Study            and Intracardiac Opacification Agent (Both Spectral and Color Flow            Doppler were utilized during procedure). Indications:    Stroke 434.91 / I63.9  History:        Patient has no prior history of Echocardiogram examinations.                 Risk Factors:Hypertension, Diabetes and Dyslipidemia.  Sonographer:    Denese Finn RCS Referring Phys: [id number] PRIYANKA O YADAV IMPRESSIONS  1. Left ventricular ejection fraction, by estimation, is 65 to 70%. The left ventricle has normal function. The left ventricle has no regional wall motion abnormalities. There is moderate left ventricular hypertrophy. Left ventricular diastolic parameters are consistent with Grade I diastolic dysfunction (impaired relaxation). Elevated left atrial pressure.  2. Right ventricular systolic function is normal. The right ventricular size is normal. There is normal pulmonary artery systolic pressure.  3. Left atrial size was moderately dilated.  4. The mitral valve is normal in structure. No evidence of mitral valve regurgitation. No evidence of mitral stenosis.  5. The tricuspid valve is abnormal.  6. The aortic valve is tricuspid. There is mild calcification of the aortic valve. There is mild thickening of the aortic valve. Aortic valve regurgitation is not visualized. No aortic stenosis is present.  7. The inferior vena cava is normal in size with greater than 50% respiratory variability, suggesting right atrial pressure of 3 mmHg.  8. Agitated saline contrast bubble study was negative, with no evidence of any interatrial shunt. FINDINGS  Left  Ventricle: Left ventricular ejection fraction, by estimation, is 65 to 70%. The left ventricle has normal function. The left ventricle has no regional wall motion abnormalities. Definity contrast agent was given IV to delineate the left ventricular  endocardial borders. The left ventricular internal cavity size was normal in size. There is moderate left ventricular hypertrophy. Left ventricular diastolic parameters are consistent with Grade I diastolic dysfunction (impaired relaxation). Elevated left atrial pressure. Right Ventricle: The right ventricular size is normal. Right vetricular wall thickness was not well visualized. Right ventricular systolic function is normal. There is normal pulmonary artery  systolic pressure. The tricuspid regurgitant velocity is 2.23 m/s, and with an assumed right atrial pressure of 3 mmHg, the estimated right ventricular systolic pressure is 22.9 mmHg. Left Atrium: Left atrial size was moderately dilated. Right Atrium: Right atrial size was normal in size. Pericardium: There is no evidence of pericardial effusion. Mitral Valve: The mitral valve is normal in structure. There is mild thickening of the mitral valve leaflet(s). There is mild calcification of the mitral valve leaflet(s). Mild mitral annular calcification. No evidence of mitral valve regurgitation. No evidence of mitral valve stenosis. Tricuspid Valve: The tricuspid valve is abnormal. Tricuspid valve regurgitation is mild . No evidence of tricuspid stenosis. Aortic Valve: The aortic valve is tricuspid. There is mild calcification of the aortic valve. There is mild thickening of the aortic valve. There is mild aortic valve annular calcification. Aortic valve regurgitation is not visualized. No aortic stenosis  is present. Aortic valve mean gradient measures 3.6 mmHg. Aortic valve peak gradient measures 7.5 mmHg. Aortic valve area, by VTI measures 2.14 cm. Pulmonic Valve: The pulmonic valve was not well visualized.  Pulmonic valve regurgitation is not visualized. No evidence of pulmonic stenosis. Aorta: The aortic root is normal in size and structure and the ascending aorta was not well visualized. Venous: The inferior vena cava is normal in size with greater than 50% respiratory variability, suggesting right atrial pressure of 3 mmHg. IAS/Shunts: The interatrial septum was not well visualized. Agitated saline contrast was given intravenously to evaluate for intracardiac shunting. Agitated saline contrast bubble study was negative, with no evidence of any interatrial shunt.  LEFT VENTRICLE PLAX 2D LVIDd:         3.90 cm   Diastology LVIDs:         2.10 cm   LV e' medial:    5.22 cm/s LV PW:         1.40 cm   LV E/e' medial:  18.1 LV IVS:        1.30 cm   LV e' lateral:   4.90 cm/s LVOT diam:     1.90 cm   LV E/e' lateral: 19.3 LV SV:         60 LV SV Index:   31 LVOT Area:     2.84 cm  RIGHT VENTRICLE RV S prime:     12.60 cm/s TAPSE (M-mode): 2.2 cm LEFT ATRIUM              Index        RIGHT ATRIUM           Index LA diam:        4.00 cm  2.02 cm/m   RA Area:     13.40 cm LA Vol (A2C):   75.9 ml  38.37 ml/m  RA Volume:   32.50 ml  16.43 ml/m LA Vol (A4C):   111.0 ml 56.11 ml/m LA Biplane Vol: 92.7 ml  46.86 ml/m  AORTIC VALVE AV Area (Vmax):    2.25 cm AV Area (Vmean):   2.28 cm AV Area (VTI):     2.14 cm AV Vmax:           137.26 cm/s AV Vmean:          89.027 cm/s AV VTI:            0.282 m AV Peak Grad:      7.5 mmHg AV Mean Grad:      3.6 mmHg LVOT Vmax:  109.00 cm/s LVOT Vmean:        71.600 cm/s LVOT VTI:          0.213 m LVOT/AV VTI ratio: 0.75  AORTA Ao Root diam: 3.10 cm MITRAL VALVE                TRICUSPID VALVE MV Area (PHT): 2.54 cm     TR Peak grad:   19.9 mmHg MV Decel Time: 299 msec     TR Vmax:        223.00 cm/s MV E velocity: 94.60 cm/s MV A velocity: 132.00 cm/s  SHUNTS MV E/A ratio:  0.72         Systemic VTI:  0.21 m                             Systemic Diam: 1.90 cm Armida Lander MD  Electronically signed by Armida Lander MD Signature Date/Time: 07/02/2023/4:14:06 PM    Final    CT Head Wo Contrast Result Date: 07/02/2023 CLINICAL DATA:  Mental status change. Dizziness. Right-sided weakness. Acute infarct of the left paramedian pons. EXAM: CT HEAD WITHOUT CONTRAST TECHNIQUE: Contiguous axial images were obtained from the base of the skull through the vertex without intravenous contrast. RADIATION DOSE REDUCTION: This exam was performed according to the departmental dose-optimization program which includes automated exposure control, adjustment of the mA and/or kV according to patient size and/or use of iterative reconstruction technique. COMPARISON:  MR head without contrast 07/02/2023 at 8:05 a.m. CT angio head and neck 07/02/2023 at 8:52 a.m. FINDINGS: Brain: Left paracentral pontine nonhemorrhagic infarct is again seen. No significant expansion of the infarct or hemorrhage is present. The mild periventricular white matter hypoattenuation is again noted. The supratentorial structures are otherwise within normal limits. Deep brain nuclei are within normal limits. Insert normal ventricles No significant extraaxial fluid collection is present. The brainstem and cerebellum are within normal limits. Midline structures are within normal limits. Vascular: Atherosclerotic calcifications are present within the cavernous internal carotid arteries and at the proximal V4 segments bilaterally. No hyperdense vessel is present. Skull: Calvarium is intact. No focal lytic or blastic lesions are present. No significant extracranial soft tissue lesion is present. Sinuses/Orbits: The paranasal sinuses and mastoid air cells are clear. A remote left orbital blowout fracture is again noted. Globes and orbits are otherwise within normal limits. IMPRESSION: 1. Left paracentral pontine nonhemorrhagic infarct is again seen. No significant expansion of the infarct or hemorrhage is present. 2. Stable mild  periventricular white matter hypoattenuation. This likely reflects the sequela of chronic microvascular ischemia. 3. Remote left orbital blowout fracture. Electronically Signed   By: Audree Leas M.D.   On: 07/02/2023 14:21   CT ANGIO HEAD NECK W WO CM Addendum Date: 07/02/2023 ADDENDUM REPORT: 07/02/2023 09:33 ADDENDUM: Study discussed by telephone with Dr. Shyrl Doyne on 07/02/2023 at 0926 hours. Electronically Signed   By: Marlise Simpers M.D.   On: 07/02/2023 09:33   Result Date: 07/02/2023 CLINICAL DATA:  73 year old female with dizziness, headache. Left pontine infarct, abnormal distal right vertebral artery flow void on MRI. EXAM: CT ANGIOGRAPHY HEAD AND NECK WITH AND WITHOUT CONTRAST TECHNIQUE: Multidetector CT imaging of the head and neck was performed using the standard protocol during bolus administration of intravenous contrast. Multiplanar CT image reconstructions and MIPs were obtained to evaluate the vascular anatomy. Carotid stenosis measurements (when applicable) are obtained utilizing NASCET criteria, using the distal internal carotid diameter as the  denominator. RADIATION DOSE REDUCTION: This exam was performed according to the departmental dose-optimization program which includes automated exposure control, adjustment of the mA and/or kV according to patient size and/or use of iterative reconstruction technique. CONTRAST:  75mL OMNIPAQUE IOHEXOL 350 MG/ML SOLN COMPARISON:  Brain MRI today reported separately. FINDINGS: CT HEAD Brain: Left pontine infarct is hypodense on series 4, image 9. No new intracranial abnormality identified. Calvarium and skull base: Chronic bilateral lamina papyracea and orbital floor fractures. Calvarium intact. Paranasal sinuses: Chronic bilateral lamina papyracea and orbital floor fractures, otherwise well aerated. Orbits: Chronic bilateral lamina papyracea and orbital floor fractures, otherwise negative orbit and scalp soft tissues. CTA NECK Skeleton: Carious  dentition. Mild for age cervical spine degeneration. No acute osseous abnormality identified. Upper chest: Negative. Other neck: Diminutive or absent thyroid. Partially retropharyngeal carotid arteries more so the left. Aortic arch: 3 vessel arch.  Calcified aortic atherosclerosis. Right carotid system: Mild brachiocephalic artery soft plaque and tortuosity without stenosis. Tortuous right CCA. Bulky soft plaque medial right CCA on series 8, image 117 with 50 % stenosis with respect to the distal vessel. Combined soft and calcified plaque at the posterior right ICA origin with less than 50 % stenosis with respect to the distal vessel. Retropharyngeal course of tortuosity. Left carotid system: Tortuous proximal left CCA with mild soft plaque, no stenosis. Bulky calcified plaque at the left carotid bifurcation and ICA origin resulting in stenosis numerically estimated at 65-70 % with respect to the distal vessel (series 8, image 104). Retropharyngeal right ICA distal to that remains patent to the skull base. Vertebral arteries: Mild plaque at the right subclavian artery origin. Right vertebral artery origin is occluded, with only minimal thread-like reconstituted the 2 enhancement which is intermittently noted. Thread-like V3 enhancement. Proximal left subclavian artery soft plaque without stenosis. Left vertebral artery origin mild to moderate stenosis due to soft plaque series 11, image 182. Patent left vertebral artery with tortuosity in the neck, no additional stenosis to the skull base. Reconstitution CTA HEAD Posterior circulation: Occluded right vertebral V4 segment, superimposed calcified plaque. Proximal left V4 vertebral segment with bulky calcified plaque and string sign stenosis series 11, image 145, but the vessel remains patent, although also moderately stenotic at the left vertebrobasilar origin. Diminished basilar artery enhancement relative to the anterior circulation. But the basilar remains patent.  However, there is severe stenosis of the distal 3rd of the basilar series 14, image 25. Basilar tip, SCA and PCA origins are patent although hypoenhancing somewhat. Bilateral PCA branches are mildly irregular, patent. Anterior circulation: Both ICA siphons are heavily calcified in the cavernous segments. Both ICA siphons remain patent but there is severe left (series 11, image 94) and moderate right siphon stenosis. Patent carotid termini, MCA and ACA origins. Moderate stenosis left ACA A1. 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 Bilateral ACA branches otherwise patent and mildly irregular. Left MCA M1 segment is patent although with mild to moderate mid and distal M1 stenosis as on series 14, image 21. Left MCA bifurcation and branches are patent with mild irregularity. Right MCA M1 segment is patent with mild irregularity. Right MCA branches are patent with mild irregularity. Venous sinuses: Patent. Anatomic variants: Effectively dominant left vertebral artery. Review of the MIP images confirms the above findings IMPRESSION: 1. Positive for acute stroke related: - Occluded Right Vertebral Artery, both at the origin and the V4 segment with only thread-like reconstitution in between. - Severe stenosis of the Left Vertebral  Artery V4 segment, with mildly diminished enhancement of the Basilar. - Severe stenosis of the distal 3rd of the Basilar Artery. This might directly correspond to the perforator infarct. 2. Positive also for up to Moderate Right and Severe Left ICA siphon due to bulky calcified plaque. And upstream 60-70% estimated stenosis of the left ICA origin due to bulky calcified plaque. 50% stenosis Right CCA due to bulky calcified plaque. 3. 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 4. Up to Moderate stenosis of the Left MCA M1 Left ACA A1 segments. 5. Expected CT appearance of left pontine infarct. No new  intracranial abnormality identified. Aortic Atherosclerosis (ICD10-I70.0). Electronically Signed: By: Marlise Simpers M.D. On: 07/02/2023 09:21   MR BRAIN WO CONTRAST Result Date: 07/02/2023 CLINICAL DATA:  73 year old female with dizziness, headache, right side weakness. Left hand numbness. EXAM: MRI HEAD WITHOUT CONTRAST TECHNIQUE: Multiplanar, multiecho pulse sequences of the brain and surrounding structures were obtained without intravenous contrast. COMPARISON:  None Available. FINDINGS: Brain: Large patchy and linear acute infarct of the left paracentral pons, slightly larger than 2 cm AP (series 3, image 17). Confluent T2 and FLAIR hyperintensity there. Confluent T1 hypointensity suggesting this is early subacute. No significant pontine expansion. No evidence of hemorrhage. No other diffusion restriction identified. Normal for age brain volume. No midline shift, mass effect, evidence of mass lesion, ventriculomegaly, extra-axial collection or acute intracranial hemorrhage. Cervicomedullary junction and pituitary are within normal limits. Mild for age scattered white matter T2 and FLAIR hyperintensity, including some right corona radiata involvement, nonspecific combined periventricular and subcortical involvement. No chronic cortical encephalomalacia identified. No chronic cerebral blood products on SWI. Deep gray nuclei and cerebellum appear negative. Vascular: Abnormal distal right vertebral artery flow void on series 8, image 4. Distal left vertebral artery, other Major intracranial vascular flow voids are preserved. Skull and upper cervical spine: Negative for age visible cervical spine. Visualized bone marrow signal is within normal limits. Sinuses/Orbits: Evidence of bilateral lamina papyracea fractures on series 8, image 9, but otherwise negative orbits soft tissues. Otherwise paranasal sinuses and mastoids are stable and well aerated. Other: Grossly normal visible internal auditory structures. Persistent  susceptibility artifact along the right lateral head, scalp, etiology and significance unclear. IMPRESSION: 1. 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 Confluent cytotoxic edema with no hemorrhage or mass effect. 2. No other acute intracranial abnormality, but abnormal flow void of the distal right vertebral artery might indicate hemodynamically significant stenosis or occlusion. A CTA is pending at this time. 3. Mild for age nonspecific white matter signal changes, probably due to small vessel disease. Electronically Signed   By: Marlise Simpers M.D.   On: 07/02/2023 08:34   Micro Results   Recent Results (from the past 240 hours)  MRSA Next Gen by PCR, Nasal     Status: None   Collection Time: 07/02/23  2:42 PM   Specimen: Nasal Mucosa; Nasal Swab  Result Value Ref Range Status   MRSA by PCR Next Gen NOT DETECTED NOT DETECTED Final    Comment: (NOTE) The GeneXpert MRSA Assay (FDA approved for NASAL specimens only), is one component of a comprehensive MRSA colonization surveillance program. It is not intended to diagnose MRSA infection nor to guide or monitor treatment for MRSA infections. Test performance is not FDA approved in patients less than 17 years old. Performed at [facility], [address] [place], [place] [address]  Today   Subjective    Leslie Lindsey today has no new complaints - Ambulated in the hallways without dyspnea on exertion palpitations no dizziness no chest pains no hypoxia        Patient has been seen and examined prior to discharge   Objective   Blood pressure (!) 186/88, pulse 91, temperature 97.9 F (36.6 C), temperature source Oral, resp. rate 19, height 5\' 6"  (1.676 m), weight 91.4 kg, SpO2 100%.  No intake or output data in the 24 hours ending 07/04/23 1413  Exam Gen:- Awake Alert, no acute distress  HEENT:- .AT, No sclera icterus Neck-Supple Neck,No JVD,.  Lungs-  CTAB , good air movement  bilaterally CV- S1, S2 normal, regular Abd-  +ve B.Sounds, Abd Soft, No tenderness,    Extremity/Skin:- No  edema,   good pulses Psych-affect is appropriate, oriented x3 Neuro-improving speech deficits, improving left-sided numbness on the face and upper extremity no additional new focal deficits, no tremors    Data Review   CBC w Diff:  Lab Results  Component Value Date   WBC 7.0 07/02/2023   HGB 12.3 07/02/2023   HCT 37.3 07/02/2023   PLT 282 07/02/2023   LYMPHOPCT 26 07/02/2023   MONOPCT 6 07/02/2023   EOSPCT 3 07/02/2023   BASOPCT 1 07/02/2023   CMP:  Lab Results  Component Value Date   NA 138 07/02/2023   K 3.6 07/02/2023   CL 106 07/02/2023   CO2 22 07/02/2023   BUN 15 07/02/2023   CREATININE 0.76 07/02/2023   PROT 7.6 07/02/2023   ALBUMIN 3.7 07/02/2023   BILITOT 0.8 07/02/2023   ALKPHOS 52 07/02/2023   AST 29 07/02/2023   ALT 30 07/02/2023  .  Total Discharge time is about 33 minutes  Colin Dawley M.D on 07/04/2023 at 2:13 PM  Go to [email or web address] -  for contact info  [facility] - Office  [phone]

## 2023-07-06 LAB — T3, FREE: T3, Free: 1.7 pg/mL — ABNORMAL LOW (ref 2.0–4.4)

## 2023-07-12 ENCOUNTER — Ambulatory Visit: Payer: PRIVATE HEALTH INSURANCE | Attending: Cardiology

## 2023-07-23 ENCOUNTER — Other Ambulatory Visit: Payer: Self-pay | Admitting: *Deleted

## 2023-07-23 DIAGNOSIS — I639 Cerebral infarction, unspecified: Secondary | ICD-10-CM

## 2023-08-11 ENCOUNTER — Ambulatory Visit (HOSPITAL_COMMUNITY): Payer: PRIVATE HEALTH INSURANCE | Attending: Vascular Surgery | Admitting: Vascular Surgery

## 2023-08-11 ENCOUNTER — Encounter: Payer: PRIVATE HEALTH INSURANCE | Admitting: Vascular Surgery

## 2023-08-11 ENCOUNTER — Encounter (HOSPITAL_COMMUNITY): Payer: PRIVATE HEALTH INSURANCE

## 2023-08-11 ENCOUNTER — Ambulatory Visit (HOSPITAL_COMMUNITY): Admission: RE | Admit: 2023-08-11 | Payer: PRIVATE HEALTH INSURANCE | Source: Ambulatory Visit

## 2023-09-20 ENCOUNTER — Encounter: Payer: Self-pay | Admitting: Family Medicine

## 2023-09-20 ENCOUNTER — Ambulatory Visit (INDEPENDENT_AMBULATORY_CARE_PROVIDER_SITE_OTHER): Payer: Self-pay | Admitting: Family Medicine

## 2023-09-20 VITALS — BP 224/118 | HR 103 | Ht 65.0 in | Wt 195.0 lb

## 2023-09-20 DIAGNOSIS — I639 Cerebral infarction, unspecified: Secondary | ICD-10-CM

## 2023-09-20 DIAGNOSIS — G459 Transient cerebral ischemic attack, unspecified: Secondary | ICD-10-CM | POA: Diagnosis not present

## 2023-09-20 DIAGNOSIS — E1165 Type 2 diabetes mellitus with hyperglycemia: Secondary | ICD-10-CM

## 2023-09-20 DIAGNOSIS — I1A0 Resistant hypertension: Secondary | ICD-10-CM | POA: Diagnosis not present

## 2023-09-20 DIAGNOSIS — Z5982 Transportation insecurity: Secondary | ICD-10-CM

## 2023-09-20 DIAGNOSIS — E038 Other specified hypothyroidism: Secondary | ICD-10-CM

## 2023-09-20 DIAGNOSIS — Z1211 Encounter for screening for malignant neoplasm of colon: Secondary | ICD-10-CM

## 2023-09-20 DIAGNOSIS — E559 Vitamin D deficiency, unspecified: Secondary | ICD-10-CM

## 2023-09-20 DIAGNOSIS — E876 Hypokalemia: Secondary | ICD-10-CM | POA: Diagnosis not present

## 2023-09-20 DIAGNOSIS — Z114 Encounter for screening for human immunodeficiency virus [HIV]: Secondary | ICD-10-CM

## 2023-09-20 DIAGNOSIS — Z1159 Encounter for screening for other viral diseases: Secondary | ICD-10-CM

## 2023-09-20 DIAGNOSIS — R7301 Impaired fasting glucose: Secondary | ICD-10-CM

## 2023-09-20 DIAGNOSIS — Z1231 Encounter for screening mammogram for malignant neoplasm of breast: Secondary | ICD-10-CM

## 2023-09-20 DIAGNOSIS — E7849 Other hyperlipidemia: Secondary | ICD-10-CM

## 2023-09-20 MED ORDER — OLMESARTAN-AMLODIPINE-HCTZ 40-10-25 MG PO TABS: 1.0000 | ORAL_TABLET | Freq: Every day | ORAL | 1 refills | Status: DC

## 2023-09-20 MED ORDER — POTASSIUM CHLORIDE CRYS ER 10 MEQ PO TBCR: 10.0000 meq | EXTENDED_RELEASE_TABLET | Freq: Every day | ORAL | 1 refills | Status: DC

## 2023-09-20 MED ORDER — CLOPIDOGREL BISULFATE 75 MG PO TABS
75.0000 mg | ORAL_TABLET | Freq: Every day | ORAL | 2 refills | Status: AC
Start: 2023-09-20 — End: 2023-12-19

## 2023-09-20 MED ORDER — CLONIDINE HCL 0.2 MG PO TABS: 0.2000 mg | ORAL_TABLET | Freq: Two times a day (BID) | ORAL | 5 refills | Status: DC | PRN

## 2023-09-20 NOTE — Patient Instructions (Addendum)
 I appreciate the opportunity to provide care to you today!    Follow up:  1 months  Labs: please stop by the lab during the week  to get your blood drawn (CBC, CMP, TSH, Lipid profile, HgA1c, Vit D)  Screening: HIV and Hep C  Schedule medicare annual wellness visit and mammogram   Hypertension Management   Your current blood pressure is above the target goal of <140/90 mmHg. To help manage your blood pressure, please continue the following medication regimen:  Olmesartan -Amlodipine -Hydrochlorothiazide 40-10-25 mg daily  Hydralazine  25 mg three times daily  Carvedilol  6.25 mg twice daily  Clonidine  0.2mg  up to twice daily as needed  Potassium 10 mEq daily -- This supplement helps counteract the potassium-lowering effect of hydrochlorothiazide.  Medication Instructions: Take your blood pressure medication at the same time each day. After taking your medication, check your blood pressure at least an hour later. If your first reading is >140/90 mmHg, wait at least 10 minutes and recheck your blood pressure. Side Effects: In the initial days of therapy, you may experience dizziness or lightheadedness as your body adjusts to the lower blood pressure; this is expected. Diet and Lifestyle: Adhere to a low-sodium diet, limiting intake to less than 1500 mg daily, and increase your physical activity. Avoid over-the-counter NSAIDs such as ibuprofen and naproxen while on this medication. Hydration and Nutrition: Stay well-hydrated by drinking at least 64 ounces of water daily. Increase your servings of fruits and vegetables and avoid excessive sodium in your diet. Long-Term Considerations: Uncontrolled hypertension can increase the risk of cardiovascular diseases, including stroke, coronary artery disease, and heart failure.  Please report to the emergency department if your blood pressure exceeds 180/120 and is accompanied by symptoms such as headaches, chest pain, palpitations, blurred vision,  or dizziness.   Please follow up if your symptoms worsen or fail to improve.    Referrals today- cardiology/hypertensive clinic  Attached with your AVS, you will find valuable resources for self-education. I highly recommend dedicating some time to thoroughly examine them.   Please continue to a heart-healthy diet and increase your physical activities. Try to exercise for at least five days a week.    It was a pleasure to see you and I look forward to continuing to work together on your health and well-being. Please do not hesitate to call the office if you need care or have questions about your care.  In case of emergency, please visit the Emergency Department for urgent care, or contact our clinic at [phone] to schedule an appointment. We're here to help you!   Have a wonderful day and week. With Gratitude, Laquanna Veazey MSN, FNP-BC

## 2023-09-20 NOTE — Progress Notes (Signed)
 New Patient Office Visit  Subjective:  Patient ID: Leslie Lindsey, female    DOB: 01-09-1951  Age: 73 y.o. MRN: [id number]  CC:  Chief Complaint  Patient presents with   Establish Care    HPI Leslie Lindsey is a 73 y.o. female with past medical history of hypothyroidism, type 2 diabetes, hyperlipidemia presents for establishing care.  Resistant hypertension: Her blood pressure remains uncontrolled today in the clinic. She reports having a stroke two months ago attributed to poorly controlled blood pressure. The patient states she is adherent to her current treatment regimen, which includes Lisinopril  40 mg daily, Clonidine  0.2 mg twice daily, Hydralazine  25 mg three times daily, Carvedilol  6.25 mg twice daily, Hydrochlorothiazide 12.5 mg daily, and Amlodipine  10 mg daily.  Today, she reports a dull headache but denies chest pain, palpitations, or shortness of breath. She missed a scheduled cardiology appointment, stating she was unaware it had been scheduled. The patient also reports persistent fatigue and drowsiness, which she attributes to the high number of antihypertensive medications she is currently taking.   Past Medical History:  Diagnosis Date   Asthma    Diabetes mellitus without complication (HCC)    Hypertension    Hyperthyroidism    PONV (postoperative nausea and vomiting)    Shortness of breath     Past Surgical History:  Procedure Laterality Date   EYE SURGERY     30 years ago   HYSTEROSCOPY WITH D & C  11/11/2010   Procedure: DILATATION AND CURETTAGE (D&C) /HYSTEROSCOPY;  Surgeon: Norleen LULLA Server, MD;  Location: [facility];  Service: Gynecology;  Laterality: N/A;  WITH REMOVAL ENDOMETRIAL POLYP    Family History  Problem Relation Age of Onset   Anesthesia problems Neg Hx    Hypotension Neg Hx    Malignant hyperthermia Neg Hx    Pseudochol deficiency Neg Hx     Social History   Socioeconomic History   Marital status: Divorced    Spouse name: Not on  file   Number of children: Not on file   Years of education: Not on file   Highest education level: Not on file  Occupational History   Not on file  Tobacco Use   Smoking status: Never   Smokeless tobacco: Never  Vaping Use   Vaping status: Never Used  Substance and Sexual Activity   Alcohol use: No   Drug use: No   Sexual activity: Yes    Birth control/protection: Post-menopausal  Other Topics Concern   Not on file  Social History Narrative   Not on file   Social Drivers of Health   Financial Resource Strain: Not on file  Food Insecurity: No Food Insecurity (07/02/2023)   Hunger Vital Sign    Worried About Running Out of Food in the Last Year: Never true    Ran Out of Food in the Last Year: Never true  Transportation Needs: No Transportation Needs (07/02/2023)   PRAPARE - Administrator, Civil Service (Medical): No    Lack of Transportation (Non-Medical): No  Physical Activity: Not on file  Stress: Not on file  Social Connections: Moderately Integrated (07/02/2023)   Social Connection and Isolation Panel    Frequency of Communication with Friends and Family: More than three times a week    Frequency of Social Gatherings with Friends and Family: Three times a week    Attends Religious Services: More than 4 times per year    Active Member of Clubs  or Organizations: Yes    Attends Banker Meetings: More than 4 times per year    Marital Status: Divorced  Intimate Partner Violence: Not At Risk (07/02/2023)   Humiliation, Afraid, Rape, and Kick questionnaire    Fear of Current or Ex-Partner: No    Emotionally Abused: No    Physically Abused: No    Sexually Abused: No    ROS Review of Systems  Constitutional:  Negative for chills and fever.  Eyes:  Negative for visual disturbance.  Respiratory:  Negative for chest tightness and shortness of breath.   Neurological:  Positive for headaches (dull). Negative for dizziness.    Objective:   Today's  Vitals: BP (!) 224/118   Pulse (!) 103   Ht 5' 5 (1.651 m)   Wt 195 lb (88.5 kg)   SpO2 95%   BMI 32.45 kg/m   Physical Exam HENT:     Head: Normocephalic.     Mouth/Throat:     Mouth: Mucous membranes are moist.  Cardiovascular:     Rate and Rhythm: Normal rate.     Heart sounds: Normal heart sounds.  Pulmonary:     Effort: Pulmonary effort is normal.     Breath sounds: Normal breath sounds.  Neurological:     Mental Status: She is alert.      Assessment & Plan:   Resistant hypertension Assessment & Plan: Uncontrolled blood pressure noted in clinic today. Discontinue Lisinopril  40 mg daily, Amlodipine  10 mg daily, and Hydrochlorothiazide 12.5 mg daily to reduce pill burden. Initiate combination therapy with Olmesartan -Amlodipine -Hydrochlorothiazide 40-10-25 mg daily.  Continue the following medications: -Hydralazine  25 mg three times daily -Carvedilol  6.25 mg twice daily -Clonidine  0.2 mg up to twice daily as needed Potassium 10 mEq daily to help counteract potential potassium loss from hydrochlorothiazide  Encouraged the patient to seek emergency care if blood pressure exceeds 180/120 mmHg and is accompanied by symptoms such as headache, chest pain, palpitations, blurred vision, or dizziness. Pending labs to evaluate for possible secondary causes of resistant hypertension.  [facility]erral placed [facility] for [facility] care  Follow-up 1 month  to assess response to medication adjustment.   Orders: -     Ambulatory referral to [facility] -     Olmesartan -amLODIPine -HCTZ; Take 1 tablet by mouth daily.  Dispense: 90 tablet; Refill: 1 -     cloNIDine  HCl; Take 1 tablet (0.2 mg total) by mouth 2 (two) times daily as needed.  Dispense: 60 tablet; Refill: 5 -     Cortisol -     Metanephrines, plasma -     Aldosterone + renin activity w/ ratio  Hypokalemia -     Potassium Chloride  Crys ER; Take 1 tablet (10 mEq total) by mouth  daily.  Dispense: 90 tablet; Refill: 1  Transportation insecurity Assessment & Plan: The patient reports difficulty attending her appointments due to lack of stable transportation. She is unable to drive and states her son works and is not always available to bring her. Referral placed to [facility] for assistance with transportation and coordination of care needs. Will continue to monitor and support adherence to treatment plan.   Orders: -     AMB Referral [facility]  Type 2 diabetes mellitus with hyperglycemia, without long-term current use of insulin  (HCC) -     Microalbumin / creatinine urine ratio  Acute ischemic stroke (HCC) -     Clopidogrel  Bisulfate; Take 1 tablet (75 mg total) by  mouth daily. -take Aspirin  81 mg daily along with Plavix  75 mg daily for 90 days then after that STOP the Plavix   and continue ONLY Aspirin  81 mg daily indefinitely--  Dispense: 30 tablet; Refill: 2  Breast cancer screening by mammogram -     3D Screening Mammogram, Left and Right  Colon cancer screening  IFG (impaired fasting glucose) -     Hemoglobin A1c  Vitamin D  deficiency -     VITAMIN D  25 Hydroxy (Vit-D Deficiency, Fractures)  Need for hepatitis C screening test -     Hepatitis C antibody  Encounter for screening for HIV -     HIV Antibody (routine testing w rflx)  TSH (thyroid-stimulating hormone deficiency) -     TSH + free T4  Other hyperlipidemia -     Lipid panel -     CMP14+EGFR -     CBC with Differential/Platelet   Note: This chart has been completed using Engineer, civil (consulting) software, and while attempts have been made to ensure accuracy, certain words and phrases may not be transcribed as intended.    Follow-up: Return in about 1 month (around 10/21/2023).   Keniesha Adderly, FNP

## 2023-09-20 NOTE — Assessment & Plan Note (Addendum)
 Uncontrolled blood pressure noted in clinic today. Discontinue Lisinopril  40 mg daily, Amlodipine  10 mg daily, and Hydrochlorothiazide 12.5 mg daily to reduce pill burden. Initiate combination therapy with Olmesartan -Amlodipine -Hydrochlorothiazide 40-10-25 mg daily.  Continue the following medications: -Hydralazine  25 mg three times daily -Carvedilol  6.25 mg twice daily -Clonidine  0.2 mg up to twice daily as needed Potassium 10 mEq daily to help counteract potential potassium loss from hydrochlorothiazide  Encouraged the patient to seek emergency care if blood pressure exceeds 180/120 mmHg and is accompanied by symptoms such as headache, chest pain, palpitations, blurred vision, or dizziness. Pending labs to evaluate for possible secondary causes of resistant hypertension.  Referral placed to [facility] for collaborative care  Follow-up 1 month  to assess response to medication adjustment.

## 2023-09-20 NOTE — Assessment & Plan Note (Signed)
 The patient reports difficulty attending her appointments due to lack of stable transportation. She is unable to drive and states her son works and is not always available to bring her. Referral placed to [facility] for assistance with transportation and coordination of care needs. Will continue to monitor and support adherence to treatment plan.

## 2023-09-21 LAB — CMP14+EGFR
ALT: 23 IU/L (ref 0–32)
AST: 25 IU/L (ref 0–40)
Albumin: 4.6 g/dL (ref 3.9–4.9)
Alkaline Phosphatase: 89 IU/L (ref 44–121)
BUN/Creatinine Ratio: 17 (ref 12–28)
BUN: 18 mg/dL (ref 8–27)
Bilirubin Total: 0.5 mg/dL (ref 0.0–1.2)
CO2: 23 mmol/L (ref 20–29)
Calcium: 10.7 mg/dL — ABNORMAL HIGH (ref 8.7–10.3)
Chloride: 101 mmol/L (ref 96–106)
Creatinine, Ser: 1.05 mg/dL — ABNORMAL HIGH (ref 0.57–1.00)
Globulin, Total: 3.2 g/dL (ref 1.5–4.5)
Glucose: 133 mg/dL — ABNORMAL HIGH (ref 70–99)
Potassium: 4.5 mmol/L (ref 3.5–5.2)
Sodium: 140 mmol/L (ref 134–144)
Total Protein: 7.8 g/dL (ref 6.0–8.5)
eGFR: 57 mL/min/1.73 — ABNORMAL LOW (ref >59)

## 2023-09-21 LAB — CBC WITH DIFFERENTIAL/PLATELET
Basophils Absolute: 0 x10E3/uL (ref 0.0–0.2)
Basos: 1 %
EOS (ABSOLUTE): 0.2 x10E3/uL (ref 0.0–0.4)
Eos: 4 %
Hematocrit: 40.2 % (ref 34.0–46.6)
Hemoglobin: 12.9 g/dL (ref 11.1–15.9)
Immature Grans (Abs): 0 x10E3/uL (ref 0.0–0.1)
Immature Granulocytes: 0 %
Lymphocytes Absolute: 1.9 x10E3/uL (ref 0.7–3.1)
Lymphs: 28 %
MCH: 28.5 pg (ref 26.6–33.0)
MCHC: 32.1 g/dL (ref 31.5–35.7)
MCV: 89 fL (ref 79–97)
Monocytes Absolute: 0.5 x10E3/uL (ref 0.1–0.9)
Monocytes: 8 %
Neutrophils Absolute: 4 x10E3/uL (ref 1.4–7.0)
Neutrophils: 59 %
Platelets: 289 x10E3/uL (ref 150–450)
RBC: 4.53 x10E6/uL (ref 3.77–5.28)
RDW: 13.9 % (ref 11.7–15.4)
WBC: 6.7 x10E3/uL (ref 3.4–10.8)

## 2023-09-21 LAB — LIPID PANEL
Chol/HDL Ratio: 2.5 ratio (ref 0.0–4.4)
Cholesterol, Total: 211 mg/dL — ABNORMAL HIGH (ref 100–199)
HDL: 84 mg/dL (ref >39)
LDL Chol Calc (NIH): 103 mg/dL — ABNORMAL HIGH (ref 0–99)
Triglycerides: 141 mg/dL (ref 0–149)
VLDL Cholesterol Cal: 24 mg/dL (ref 5–40)

## 2023-09-21 LAB — TSH+FREE T4
Free T4: 0.79 ng/dL — ABNORMAL LOW (ref 0.82–1.77)
TSH: 12.4 u[IU]/mL — ABNORMAL HIGH (ref 0.450–4.500)

## 2023-09-21 LAB — VITAMIN D 25 HYDROXY (VIT D DEFICIENCY, FRACTURES): Vit D, 25-Hydroxy: 31.3 ng/mL (ref 30.0–100.0)

## 2023-09-21 LAB — HEMOGLOBIN A1C
Est. average glucose Bld gHb Est-mCnc: 186 mg/dL
Hgb A1c MFr Bld: 8.1 % — ABNORMAL HIGH (ref 4.8–5.6)

## 2023-09-21 LAB — HIV ANTIBODY (ROUTINE TESTING W REFLEX): HIV Screen 4th Generation wRfx: NONREACTIVE

## 2023-09-21 LAB — HEPATITIS C ANTIBODY: Hep C Virus Ab: NONREACTIVE

## 2023-09-23 ENCOUNTER — Telehealth: Payer: Self-pay | Admitting: *Deleted

## 2023-09-23 ENCOUNTER — Ambulatory Visit: Admitting: Neurology

## 2023-09-23 ENCOUNTER — Telehealth: Payer: Self-pay

## 2023-09-23 NOTE — Progress Notes (Unsigned)
 Complex Care Management Note Care Guide Note  09/23/2023 Name: Leslie Lindsey MRN: [id number] DOB: 05-15-1950   Complex Care Management Outreach Attempts: An unsuccessful telephone outreach was attempted today to offer the patient information about available complex care management services.  Follow Up Plan:  Additional outreach attempts will be made to offer the patient complex care management information and services.   Encounter Outcome:  No Answer  Jeoffrey Buffalo , RMA       [facility] Guide  Direct Dial: [phone]  Website: [email or web address]

## 2023-09-23 NOTE — Progress Notes (Signed)
 Complex Care Management Note Care Guide Note  09/23/2023 Name: Leslie Lindsey MRN: [id number] DOB: 18-Sep-1950  Leslie Lindsey is a 73 y.o. year old female who is a primary care patient of Zarwolo, Gloria, FNP . The community resource team was consulted for assistance with Transportation Needs   SDOH screenings and interventions completed:  Yes     SDOH Interventions Today    Flowsheet Row Most Recent Value  SDOH Interventions   Transportation Interventions Payor Benefit  [Provided coomunity resources for tranportation]     Care guide performed the following interventions: Patient provided with information about care guide support team and interviewed to confirm resource needs.  Follow Up Plan:  No further follow up planned at this time. The patient has been provided with needed resources.  Encounter Outcome:  Patient Visit Completed  Roseana Rhine Greenauer-Moran  [facility] HealthPopulation Health Care Guide  Direct Dial:[phone] Fax:[phone] Website: [email or web address]

## 2023-09-24 NOTE — Progress Notes (Signed)
 Complex Care Management Note  Care Guide Note 09/24/2023 Name: Leslie Lindsey MRN: [id number] DOB: [birth year]  Leslie Lindsey is a 73 y.o. year old female who sees Zarwolo, Gloria, FNP for primary care. I reached out to Clemencia LELON Sitter by phone today to offer complex care management services.  [name] was given information about Complex Care Management services today including:   The Complex Care Management services include support from the care team which includes your Nurse Care Manager, Clinical Social Worker, or Pharmacist.  The Complex Care Management team is here to help remove barriers to the health concerns and goals most important to you. Complex Care Management services are voluntary, and the patient may decline or stop services at any time by request to their care team member.   Complex Care Management Consent Status: Patient agreed to services and verbal consent obtained.   Follow up plan:  Telephone appointment with complex care management team member scheduled for:  10/08/2023  Encounter Outcome:  Patient Scheduled  Jeoffrey Buffalo , RMA     [place]  [facility] Guide  Direct Dial: [phone]  Website: [email or web address]

## 2023-09-25 ENCOUNTER — Ambulatory Visit: Payer: Self-pay | Admitting: Family Medicine

## 2023-09-25 DIAGNOSIS — E1165 Type 2 diabetes mellitus with hyperglycemia: Secondary | ICD-10-CM

## 2023-09-25 DIAGNOSIS — E038 Other specified hypothyroidism: Secondary | ICD-10-CM

## 2023-09-25 LAB — ALDOSTERONE + RENIN ACTIVITY W/ RATIO
Aldos/Renin Ratio: 0.5 (ref 0.0–30.0)
Aldosterone: 1.2 ng/dL (ref 0.0–30.0)
Renin Activity, Plasma: 2.281 ng/mL/h (ref 0.167–5.380)

## 2023-09-25 LAB — METANEPHRINES, PLASMA
Metanephrine, Free: <25 pg/mL (ref 0.0–88.0)
Normetanephrine, Free: 56.1 pg/mL (ref 0.0–285.2)

## 2023-09-25 MED ORDER — METFORMIN HCL 1000 MG PO TABS: 1000.0000 mg | ORAL_TABLET | Freq: Two times a day (BID) | ORAL | 3 refills | Status: DC

## 2023-09-25 MED ORDER — EMPAGLIFLOZIN 10 MG PO TABS: 10.0000 mg | ORAL_TABLET | Freq: Every day | ORAL | 1 refills | Status: DC

## 2023-09-25 MED ORDER — RYBELSUS 7 MG PO TABS: 7.0000 mg | ORAL_TABLET | Freq: Every day | ORAL | 1 refills | Status: DC

## 2023-09-25 MED ORDER — LEVOTHYROXINE SODIUM 125 MCG PO TABS: 125.0000 ug | ORAL_TABLET | Freq: Every day | ORAL | 3 refills | Status: DC

## 2023-10-04 ENCOUNTER — Telehealth: Payer: Self-pay | Admitting: Neurology

## 2023-10-04 NOTE — Telephone Encounter (Signed)
 Appointment details confirmed

## 2023-10-06 ENCOUNTER — Ambulatory Visit: Attending: Vascular Surgery | Admitting: Vascular Surgery

## 2023-10-06 ENCOUNTER — Encounter: Payer: Self-pay | Admitting: Vascular Surgery

## 2023-10-06 ENCOUNTER — Ambulatory Visit (HOSPITAL_COMMUNITY)
Admission: RE | Admit: 2023-10-06 | Discharge: 2023-10-06 | Disposition: A | Discharge status: Home / Self Care | Source: Ambulatory Visit | Attending: Vascular Surgery | Admitting: Vascular Surgery

## 2023-10-06 VITALS — BP 157/82 | HR 120 | Temp 97.9°F | Ht 65.0 in | Wt 193.0 lb

## 2023-10-06 DIAGNOSIS — I639 Cerebral infarction, unspecified: Secondary | ICD-10-CM | POA: Insufficient documentation

## 2023-10-06 DIAGNOSIS — I6523 Occlusion and stenosis of bilateral carotid arteries: Secondary | ICD-10-CM | POA: Diagnosis not present

## 2023-10-06 NOTE — Progress Notes (Signed)
 Patient ID: Leslie Lindsey, female   DOB: 10/16/1950, 73 y.o.   MRN: [id number]  Reason for Consult: New Patient (Initial Visit)   Referred by Pearlean Manus, MD  Subjective:     HPI:  Leslie Lindsey is a 73 y.o. female with recent admission to [facility] for CVA.  During that time she was found to have intracranial carotid stenosis.  She has risk factors of diabetes, hypertension which runs in her family and long family history of stroke.  She has residual weakness in the right lower extremity currently walking with help of a cane but otherwise only has mild headaches and has returned to her baseline.  She is unsure of the exact medication regimen but does not think she was on antiplatelets or statin medications at the time of the procedure she is now on Lipitor and aspirin  and Plavix .  Past Medical History:  Diagnosis Date   Asthma    Diabetes mellitus without complication (HCC)    Hypertension    Hyperthyroidism    PONV (postoperative nausea and vomiting)    Shortness of breath    Family History  Problem Relation Age of Onset   Anesthesia problems Neg Hx    Hypotension Neg Hx    Malignant hyperthermia Neg Hx    Pseudochol deficiency Neg Hx    Past Surgical History:  Procedure Laterality Date   EYE SURGERY     30 years ago   HYSTEROSCOPY WITH D & C  11/11/2010   Procedure: DILATATION AND CURETTAGE (D&C) /HYSTEROSCOPY;  Surgeon: Norleen LULLA Server, MD;  Location: [facility];  Service: Gynecology;  Laterality: N/A;  WITH REMOVAL ENDOMETRIAL POLYP    Short Social History:  Social History   Tobacco Use   Smoking status: Never   Smokeless tobacco: Never  Substance Use Topics   Alcohol use: No    Allergies  Allergen Reactions   Peanut-Containing Drug Products Anaphylaxis    Current Outpatient Medications  Medication Sig Dispense Refill   acetaminophen  (TYLENOL ) 325 MG tablet Take 2 tablets (650 mg total) by mouth every 6 (six) hours as needed for mild pain (pain  score 1-3) or headache (or Fever >/= 101).     albuterol  (VENTOLIN  HFA) 108 (90 Base) MCG/ACT inhaler Inhale 2 puffs into the lungs every 4 (four) hours as needed for wheezing or shortness of breath. 8 g 1   aspirin  EC 81 MG tablet Take 1 tablet (81 mg total) by mouth daily with breakfast. -take Aspirin  81 mg daily along with Plavix  75 mg daily for 90 days then after that STOP the Plavix   and continue ONLY Aspirin  81 mg daily indefinitely-- 30 tablet 12   atorvastatin  (LIPITOR) 40 MG tablet Take 1 tablet (40 mg total) by mouth daily. 30 tablet 5   carvedilol  (COREG ) 6.25 MG tablet Take 1 tablet (6.25 mg total) by mouth 2 (two) times daily. 60 tablet 11   cloNIDine  (CATAPRES ) 0.2 MG tablet Take 1 tablet (0.2 mg total) by mouth 2 (two) times daily as needed. 60 tablet 5   clopidogrel  (PLAVIX ) 75 MG tablet Take 1 tablet (75 mg total) by mouth daily. -take Aspirin  81 mg daily along with Plavix  75 mg daily for 90 days then after that STOP the Plavix   and continue ONLY Aspirin  81 mg daily indefinitely-- 30 tablet 2   diphenhydrAMINE (SOMINEX) 25 MG tablet Take 25 mg by mouth at bedtime as needed for allergies or itching. For allergies     empagliflozin  (  JARDIANCE ) 10 MG TABS tablet Take 1 tablet (10 mg total) by mouth daily before breakfast. 30 tablet 1   hydrALAZINE  (APRESOLINE ) 25 MG tablet Take 1 tablet (25 mg total) by mouth 3 (three) times daily. 90 tablet 5   levothyroxine  (SYNTHROID ) 125 MCG tablet Take 1 tablet (125 mcg total) by mouth daily. 90 tablet 3   LORazepam  (ATIVAN ) 1 MG tablet Take 1 mg by mouth 3 (three) times daily as needed for anxiety.     meclizine  (ANTIVERT ) 25 MG tablet Take 1 tablet (25 mg total) by mouth 3 (three) times daily as needed for dizziness (vertigo). 30 tablet 0   metFORMIN  (GLUCOPHAGE ) 1000 MG tablet Take 1 tablet (1,000 mg total) by mouth 2 (two) times daily with a meal. 180 tablet 3   Olmesartan -amLODIPine -HCTZ 40-10-25 MG TABS Take 1 tablet by mouth daily. 90 tablet 1    potassium chloride  (KLOR-CON  M) 10 MEQ tablet Take 1 tablet (10 mEq total) by mouth daily. 90 tablet 1   Semaglutide  (RYBELSUS ) 7 MG TABS Take 1 tablet (7 mg total) by mouth daily. 30 tablet 1   No current facility-administered medications for this visit.    Review of Systems  Constitutional:  Constitutional negative. HENT: HENT negative.  Eyes: Eyes negative.  Respiratory: Respiratory negative.  Cardiovascular: Cardiovascular negative.  GI: Gastrointestinal negative.  Musculoskeletal: Musculoskeletal negative.  Skin: Skin negative.  Neurological: Positive for focal weakness and headaches.  Hematologic: Hematologic/lymphatic negative.  Psychiatric: Psychiatric negative.        Objective:  Objective   Vitals:   10/06/23 1343 10/06/23 1345  BP: (!) 175/98 (!) 157/82  Pulse: (!) 120   Temp: 97.9 F (36.6 C)   Weight: 193 lb (87.5 kg)   Height: 5' 5 (1.651 m)    Body mass index is 32.12 kg/m.  Physical Exam HENT:     Mouth/Throat:     Mouth: Mucous membranes are moist.  Eyes:     Pupils: Pupils are equal, round, and reactive to light.  Neck:     Vascular: No carotid bruit.  Cardiovascular:     Rate and Rhythm: Normal rate.     Pulses: Normal pulses.  Pulmonary:     Effort: Pulmonary effort is normal.  Abdominal:     General: Abdomen is flat.     Palpations: Abdomen is soft.  Skin:    General: Skin is warm and dry.     Capillary Refill: Capillary refill takes less than 2 seconds.  Neurological:     General: No focal deficit present.     Mental Status: She is alert.  Psychiatric:        Mood and Affect: Mood normal.        Thought Content: Thought content normal.        Judgment: Judgment normal.     Data: CT ANGIOGRAPHY HEAD AND NECK WITH AND WITHOUT CONTRAST   TECHNIQUE: Multidetector CT imaging of the head and neck was performed using the standard protocol during bolus administration of intravenous contrast. Multiplanar CT image reconstructions  and MIPs were obtained to evaluate the vascular anatomy. Carotid stenosis measurements (when applicable) are obtained utilizing NASCET criteria, using the distal internal carotid diameter as the denominator.   RADIATION DOSE REDUCTION: This exam was performed according to the departmental dose-optimization program which includes automated exposure control, adjustment of the mA and/or kV according to patient size and/or use of iterative reconstruction technique.   CONTRAST:  75mL OMNIPAQUE  IOHEXOL  350 MG/ML SOLN  COMPARISON:  Brain MRI today reported separately.   FINDINGS: CT HEAD   Brain: Left pontine infarct is hypodense on series 4, image 9. No new intracranial abnormality identified.   Calvarium and skull base: Chronic bilateral lamina papyracea and orbital floor fractures. Calvarium intact.   Paranasal sinuses: Chronic bilateral lamina papyracea and orbital floor fractures, otherwise well aerated.   Orbits: Chronic bilateral lamina papyracea and orbital floor fractures, otherwise negative orbit and scalp soft tissues.   CTA NECK   Skeleton: Carious dentition. Mild for age cervical spine degeneration. No acute osseous abnormality identified.   Upper chest: Negative.   Other neck: Diminutive or absent thyroid. Partially retropharyngeal carotid arteries more so the left.   Aortic arch: 3 vessel arch.  Calcified aortic atherosclerosis.   Right carotid system: Mild brachiocephalic artery soft plaque and tortuosity without stenosis. Tortuous right CCA. Bulky soft plaque medial right CCA on series 8, image 117 with 50 % stenosis with respect to the distal vessel. Combined soft and calcified plaque at the posterior right ICA origin with less than 50 % stenosis with respect to the distal vessel. Retropharyngeal course of tortuosity.   Left carotid system: Tortuous proximal left CCA with mild soft plaque, no stenosis. Bulky calcified plaque at the left  carotid bifurcation and ICA origin resulting in stenosis numerically estimated at 65-70 % with respect to the distal vessel (series 8, image 104). Retropharyngeal right ICA distal to that remains patent to the skull base.   Vertebral arteries: Mild plaque at the right subclavian artery origin. Right vertebral artery origin is occluded, with only minimal thread-like reconstituted the 2 enhancement which is intermittently noted. Thread-like V3 enhancement.   Proximal left subclavian artery soft plaque without stenosis. Left vertebral artery origin mild to moderate stenosis due to soft plaque series 11, image 182. Patent left vertebral artery with tortuosity in the neck, no additional stenosis to the skull base. Reconstitution   CTA HEAD   Posterior circulation: Occluded right vertebral V4 segment, superimposed calcified plaque.   Proximal left V4 vertebral segment with bulky calcified plaque and string sign stenosis series 11, image 145, but the vessel remains patent, although also moderately stenotic at the left vertebrobasilar origin.   Diminished basilar artery enhancement relative to the anterior circulation. But the basilar remains patent. However, there is severe stenosis of the distal 3rd of the basilar series 14, image 25.   Basilar tip, SCA and PCA origins are patent although hypoenhancing somewhat. Bilateral PCA branches are mildly irregular, patent.   Anterior circulation: Both ICA siphons are heavily calcified in the cavernous segments. Both ICA siphons remain patent but there is severe left (series 11, image 94) and moderate right siphon stenosis.   Patent carotid termini, MCA and ACA origins. Moderate stenosis left ACA A1. 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 Bilateral ACA branches otherwise patent and mildly irregular.   Left MCA M1 segment is patent although with mild to moderate mid and distal M1 stenosis as on  series 14, image 21. Left MCA bifurcation and branches are patent with mild irregularity. Right MCA M1 segment is patent with mild irregularity. Right MCA branches are patent with mild irregularity.   Venous sinuses: Patent.   Anatomic variants: Effectively dominant left vertebral artery.   Review of the MIP images confirms the above findings   IMPRESSION: 1. Positive for acute stroke related: - Occluded Right Vertebral Artery, both at the origin and the V4 segment with only thread-like reconstitution  in between. - Severe stenosis of the Left Vertebral Artery V4 segment, with mildly diminished enhancement of the Basilar.   - Severe stenosis of the distal 3rd of the Basilar Artery. This might directly correspond to the perforator infarct.   2. Positive also for up to Moderate Right and Severe Left ICA siphon due to bulky calcified plaque. And upstream 60-70% estimated stenosis of the left ICA origin due to bulky calcified plaque. 50% stenosis Right CCA due to bulky calcified plaque.   3. 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   4. Up to Moderate stenosis of the Left MCA M1 Left ACA A1 segments.   5. Expected CT appearance of left pontine infarct. No new intracranial abnormality identified.   Aortic Atherosclerosis (ICD10-I70.0).  Right Carotid Findings:  +----------+--------+--------+--------+------------------+--------+           PSV cm/sEDV cm/sStenosisPlaque DescriptionComments  +----------+--------+--------+--------+------------------+--------+  CCA Prox  67      67                                          +----------+--------+--------+--------+------------------+--------+  CCA Distal58      10              smooth                      +----------+--------+--------+--------+------------------+--------+  ICA Prox  191     73      40-59%  heterogenous                 +----------+--------+--------+--------+------------------+--------+  ICA Mid   72      20                                          +----------+--------+--------+--------+------------------+--------+  ICA Distal69      28                                          +----------+--------+--------+--------+------------------+--------+  ECA      95      5                                           +----------+--------+--------+--------+------------------+--------+   +----------+--------+-------+----------------+-------------------+           PSV cm/sEDV cmsDescribe        Arm Pressure (mmHG)  +----------+--------+-------+----------------+-------------------+  [id number]           Multiphasic, WNL                     +----------+--------+-------+----------------+-------------------+   +---------+--------+--+--------+-+-----------------------------+  VertebralPSV cm/s14EDV cm/s4very small, could be a branch  +---------+--------+--+--------+-+-----------------------------+   Very high bifurcation.   Left Carotid Findings:  +----------+--------+--------+--------+------------------+-----------------  -+           PSV cm/sEDV cm/sStenosisPlaque DescriptionComments             +----------+--------+--------+--------+------------------+-----------------  -+  CCA Prox  93      10  intimal  thickening  +----------+--------+--------+--------+------------------+-----------------  -+  CCA Distal62      10              smooth                                 +----------+--------+--------+--------+------------------+-----------------  -+  ICA Prox  135     16      1-39%   heterogenous                           +----------+--------+--------+--------+------------------+-----------------  -+  ICA Mid   63      18                                                      +----------+--------+--------+--------+------------------+-----------------  -+  ECA      100     5                                                      +----------+--------+--------+--------+------------------+-----------------  -+   +----------+--------+--------+----------------+-------------------+           PSV cm/sEDV cm/sDescribe        Arm Pressure (mmHG)  +----------+--------+--------+----------------+-------------------+  [id number]             Multiphasic, WNL                     +----------+--------+--------+----------------+-------------------+   +---------+--------+--+--------+--+---------+  VertebralPSV cm/s73EDV cm/s19Antegrade  +---------+--------+--+--------+--+---------+   Very high bifurcation; unable to see distal LICA.      Summary:  Right Carotid: Velocities in the right ICA are consistent with a 40-59%                 stenosis. Non-hemodynamically significant plaque <50% noted  in                the CCA.   Left Carotid: Velocities in the left ICA are consistent with a 1-39%  stenosis.               Non-hemodynamically significant plaque <50% noted in the  CCA.   Vertebrals: Left vertebral artery demonstrates antegrade flow. Right  atypical -               could be a branch.  Subclavians: Normal flow hemodynamics were seen in bilateral subclavian               arteries.      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  CTA was reviewed with her which demonstrates soft plaque at the distal right common carotid artery with mild stenosis of the proximal right ICA and bulky calcified plaque of the left proximal ICA with resultant moderate stenosis which was not redemonstrated on today's duplex.  These appear to be asymptomatic lesions and she is on maximal medical therapy.  As such we will follow her up yearly  with carotid duplex.  All questions were answered she demonstrates good understanding.  Penne Lonni Colorado MD [facility]

## 2023-10-08 ENCOUNTER — Other Ambulatory Visit: Payer: Self-pay | Admitting: *Deleted

## 2023-10-08 ENCOUNTER — Other Ambulatory Visit: Payer: Self-pay

## 2023-10-08 NOTE — Patient Instructions (Signed)
 Visit Information  Thank you for taking time to visit with me today. Please don't hesitate to contact me if I can be of assistance to you before our next scheduled appointment.  Our next appointment is by telephone on 10-25-23 at 10:30 am Please call the care guide team at [phone] if you need to cancel or reschedule your appointment.   Following is a copy of your care plan:   Goals Addressed             This Visit's Progress    VBCI RN Care Plan- DM       Problems:  Chronic Disease Management support and education needs related to DMII  Goal: Over the next 90 days the Patient will attend all scheduled medical appointments: with PCP and Specialist as evidenced by keeping all scheduled appointments        continue to work with RN Care Manager and/or Social Worker to address care management and care coordination needs related to DMII as evidenced by adherence to care management team scheduled appointments     take all medications exactly as prescribed and will call provider for medication related questions as evidenced by compliance with all medications.     verbalize basic understanding of DMII disease process and self health management plan as evidenced by verbal explanation, recognize and monitor symptoms and lifestyle modifications.   Interventions:   Diabetes Interventions: Assessed patient's understanding of A1c goal: <7% Provided education to patient about basic DM disease process Reviewed medications with patient and discussed importance of medication adherence Counseled on importance of regular laboratory monitoring as prescribed Discussed plans with patient for ongoing care management follow up and provided patient with direct contact information for care management team Provided patient with written educational materials related to hypo and hyperglycemia and importance of correct treatment Reviewed scheduled/upcoming provider appointments including: PCP appointment   12-02-23 Advised patient, providing education and rationale, to check cbg check bloods before meals and at bedtime and record, calling provider for findings outside established parameters Review of patient status, including review of consultants reports, relevant laboratory and other test results, and medications completed Screening for signs and symptoms of depression related to chronic disease state  Assessed social determinant of health barriers Lab Results  Component Value Date   HGBA1C 8.1 (H) 09/20/2023    Patient Self-Care Activities:  Attend all scheduled provider appointments Call pharmacy for medication refills 3-7 days in advance of running out of medications Call provider office for new concerns or questions  Take medications as prescribed    Plan:  Telephone follow up appointment with care management team member scheduled for:  10-25-23 at 10:30 am.          VBCI RN Care Plan- HTN       Problems:  Chronic Disease Management support and education needs related to HTN  Goal: Over the next 90 days the Patient will attend all scheduled medical appointments: PCP and Specialist as evidenced by keeping all scheduled appointments.         continue to work with Medical illustrator and/or Social Worker to address care management and care coordination needs related to HTN as evidenced by adherence to care management team scheduled appointments     take all medications exactly as prescribed and will call provider for medication related questions as evidenced by compliance with all medications.     verbalize basic understanding of HTN disease process and self health management plan as evidenced by verbal explanation, recognizing symptoms  and lifestyle modifications.    Interventions:   Hypertension Interventions: Last practice recorded BP readings:  BP Readings from Last 3 Encounters:  10/06/23 (!) 157/82  09/20/23 (!) 224/118  07/04/23 (!) 187/81   Most recent eGFR/CrCl:  Lab  Results  Component Value Date   EGFR 57 (L) 09/20/2023    No components found for: CRCL  Evaluation of current treatment plan related to hypertension self management and patient's adherence to plan as established by provider Provided education to patient re: stroke prevention, s/s of heart attack and stroke Reviewed medications with patient and discussed importance of compliance Discussed plans with patient for ongoing care management follow up and provided patient with direct contact information for care management team Advised patient, providing education and rationale, to monitor blood pressure daily and record, calling PCP for findings outside established parameters Reviewed scheduled/upcoming provider appointments including:  Discussed complications of poorly controlled blood pressure such as heart disease, stroke, circulatory complications, vision complications, kidney impairment, sexual dysfunction Screening for signs and symptoms of depression related to chronic disease state  Assessed social determinant of health barriers  Patient Self-Care Activities:  Attend all scheduled provider appointments Call pharmacy for medication refills 3-7 days in advance of running out of medications Call provider office for new concerns or questions  Take medications as prescribed    Plan:  Telephone follow up appointment with care management team member scheduled for:  10-25-23 at 10:30 am             Please call the Suicide and Crisis Lifeline: 988 call the [place]  National Suicide Prevention Lifeline: 819-397-8289 or TTY: (610)408-0006 TTY 661-728-2276) to talk to a trained counselor call the [facility]: (206) [phone] if you are experiencing a Mental Health or Behavioral Health Crisis or need someone to talk to.  The patient verbalized understanding of instructions, educational materials, and care plan provided today and agreed to receive a mailed copy of patient  instructions, educational materials, and care plan.   Alsha Meland, RN, BSN, Theatre manager [employer] [phone]

## 2023-10-08 NOTE — Patient Outreach (Signed)
 Complex Care Management   Visit Note  10/08/2023  Name:  Leslie Lindsey MRN: [id number] DOB: August 23, 1950  Situation: Referral received for Complex Care Management related to Diabetes with Complications and HTN I obtained verbal consent from Patient.  Visit completed with Patient  on the phone  Background:   Past Medical History:  Diagnosis Date   Asthma    Diabetes mellitus without complication (HCC)    Hypertension    Hyperthyroidism    PONV (postoperative nausea and vomiting)    Shortness of breath     Assessment: Patient Reported Symptoms:  Cognitive Cognitive Status: Alert and oriented to person, place, and time, Insightful and able to interpret abstract concepts, Normal speech and language skills   Health Maintenance Behaviors: Annual physical exam, Stress management, Healthy diet Healing Pattern: Average Health Facilitated by: Prayer/meditation, Rest, Stress management, Healthy diet  Neurological Neurological Review of Symptoms: Dizziness (Small amount of dizziness reports has gotten better after stroke.  Will reach out to provider to refill meclizine .) Neurological Management Strategies: Medication therapy, Routine screening Neurological Self-Management Outcome: 4 (good)  HEENT HEENT Symptoms Reported: No symptoms reported HEENT Management Strategies: Routine screening HEENT Self-Management Outcome: 4 (good)    Cardiovascular Cardiovascular Symptoms Reported: No symptoms reported Does patient have uncontrolled Hypertension?: Yes Is patient checking Blood Pressure at home?: Yes Patient's Recent BP reading at home: 10-06-23 157/87. Reports that she did not take today.  Reports that she will have her son take her BP today when he comes home from work. Cardiovascular Management Strategies: Medication therapy, Routine screening Cardiovascular Self-Management Outcome: 4 (good)  Respiratory Respiratory Symptoms Reported: No symptoms reported Respiratory Management Strategies:  Routine screening Respiratory Self-Management Outcome: 4 (good)  Endocrine Endocrine Symptoms Reported: No symptoms reported Is patient diabetic?: Yes Is patient checking blood sugars at home?: Yes List most recent blood sugar readings, include date and time of day: Patient reports that her son check her blood sugar.  Patient reports that she does not remember what last BS was. Endocrine Self-Management Outcome: 3 (uncertain)  Gastrointestinal Gastrointestinal Symptoms Reported: No symptoms reported Gastrointestinal Management Strategies: Medication therapy Gastrointestinal Self-Management Outcome: 4 (good)    Genitourinary Genitourinary Symptoms Reported: No symptoms reported Genitourinary Management Strategies: Medication therapy Genitourinary Self-Management Outcome: 4 (good)  Integumentary Integumentary Symptoms Reported: No symptoms reported Skin Management Strategies: Routine screening Skin Self-Management Outcome: 4 (good)  Musculoskeletal Musculoskelatal Symptoms Reviewed: Weakness (Right sided weakness. Reports that the weakness is better.) Additional Musculoskeletal Details: Reports that she walks and excerise as she can.  Not taking any Physical Therapy. Musculoskeletal Management Strategies: Medication therapy, Routine screening Musculoskeletal Self-Management Outcome: 3 (uncertain) Falls in the past year?: Yes Number of falls in past year: 1 or less Was there an injury with Fall?: No Fall Risk Category Calculator: 1 Patient Fall Risk Level: Low Fall Risk Patient at Risk for Falls Due to: History of fall(s) (Fall when she had stroke in May 2025)  Psychosocial Psychosocial Symptoms Reported: No symptoms reported Behavioral Management Strategies: Support system Behavioral Health Self-Management Outcome: 4 (good) Major Change/Loss/Stressor/Fears (CP):  (Had stroke in May 2025) Techniques to Cope with Loss/Stress/Change: Spiritual practice(s) Quality of Family Relationships:  helpful, involved, supportive Do you feel physically threatened by others?: No      10/08/2023    9:56 AM  Depression screen PHQ 2/9  Decreased Interest 0  Down, Depressed, Hopeless 1  PHQ - 2 Score 1  Altered sleeping 1  Tired, decreased energy 1  Change in appetite 0  Feeling bad or failure about yourself  1  Trouble concentrating 0  Moving slowly or fidgety/restless 0  Suicidal thoughts 0  PHQ-9 Score 4  Difficult doing work/chores Somewhat difficult   PHQ2-9 Depression Screening   Little interest or pleasure in doing things Not at all  Feeling down, depressed, or hopeless Several days  PHQ-2 - Total Score 1  Trouble falling or staying asleep, or sleeping too much Several days  Feeling tired or having little energy Several days  Poor appetite or overeating  Not at all  Feeling bad about yourself - or that you are a failure or have let yourself or your family down Several days  Trouble concentrating on things, such as reading the newspaper or watching television Not at all  Moving or speaking so slowly that other people could have noticed.  Or the opposite - being so fidgety or restless that you have been moving around a lot more than usual Not at all  Thoughts that you would be better off dead, or hurting yourself in some way Not at all  PHQ2-9 Total Score 4  If you checked off any problems, how difficult have these problems made it for you to do your work, take care of things at home, or get along with other people Somewhat difficult  Depression Interventions/Treatment       Vitals:    Medications Reviewed Today     Reviewed by Jorja Nichole LABOR, RN (Case Manager) on 10/08/23 at 0930  Med List Status: <None>   Medication Order Taking? Sig Documenting Provider Last Dose Status Informant  acetaminophen  (TYLENOL ) 325 MG tablet [id number] Yes Take 2 tablets (650 mg total) by mouth every 6 (six) hours as needed for mild pain (pain score 1-3) or headache (or Fever >/= 101).  Pearlean Manus, MD  Active   albuterol  (VENTOLIN  HFA) 108 (90 Base) MCG/ACT inhaler [id number] Yes Inhale 2 puffs into the lungs every 4 (four) hours as needed for wheezing or shortness of breath. Pearlean Manus, MD  Active   aspirin  EC 81 MG tablet [id number] Yes Take 1 tablet (81 mg total) by mouth daily with breakfast. -take Aspirin  81 mg daily along with Plavix  75 mg daily for 90 days then after that STOP the Plavix   and continue ONLY Aspirin  81 mg daily indefinitely-- Pearlean Manus, MD  Active   atorvastatin  (LIPITOR) 40 MG tablet [id number] Yes Take 1 tablet (40 mg total) by mouth daily. Pearlean Manus, MD  Active   carvedilol  (COREG ) 6.25 MG tablet [id number] Yes Take 1 tablet (6.25 mg total) by mouth 2 (two) times daily. Pearlean Manus, MD  Active   cloNIDine  (CATAPRES ) 0.2 MG tablet [id number] Yes Take 1 tablet (0.2 mg total) by mouth 2 (two) times daily as needed. Zarwolo, Gloria, FNP  Active   clopidogrel  (PLAVIX ) 75 MG tablet [id number] Yes Take 1 tablet (75 mg total) by mouth daily. -take Aspirin  81 mg daily along with Plavix  75 mg daily for 90 days then after that STOP the Plavix   and continue ONLY Aspirin  81 mg daily indefinitely-- Zarwolo, Gloria, FNP  Active   diphenhydrAMINE (SOMINEX) 25 MG tablet [id number] Yes Take 25 mg by mouth at bedtime as needed for allergies or itching. For allergies [provider]  Active Self, Pharmacy Records  empagliflozin  (JARDIANCE ) 10 MG TABS tablet [id number] Yes Take 1 tablet (10 mg total) by mouth daily before breakfast. Zarwolo, Gloria, FNP  Active   hydrALAZINE  (APRESOLINE ) 25 MG tablet [id number] Yes Take 1 tablet (  25 mg total) by mouth 3 (three) times daily. Pearlean Manus, MD  Active   levothyroxine  (SYNTHROID ) 125 MCG tablet [id number] Yes Take 1 tablet (125 mcg total) by mouth daily. Zarwolo, Gloria, FNP  Active   LORazepam  (ATIVAN ) 1 MG tablet [id number]  Take 1 mg by mouth 3 (three) times daily as needed for anxiety.  Patient not  taking: Reported on 10/08/2023   [provider]  Active Self, Pharmacy Records  meclizine  (ANTIVERT ) 25 MG tablet [id number]  Take 1 tablet (25 mg total) by mouth 3 (three) times daily as needed for dizziness (vertigo).  Patient not taking: Reported on 10/08/2023   Pearlean Manus, MD  Active   metFORMIN  (GLUCOPHAGE ) 1000 MG tablet [id number] Yes Take 1 tablet (1,000 mg total) by mouth 2 (two) times daily with a meal. Zarwolo, Gloria, FNP  Active   Olmesartan -amLODIPine -HCTZ 40-10-25 MG TABS [id number] Yes Take 1 tablet by mouth daily. Zarwolo, Gloria, FNP  Active   potassium chloride  (KLOR-CON  M) 10 MEQ tablet [id number] Yes Take 1 tablet (10 mEq total) by mouth daily. Zarwolo, Gloria, FNP  Active   Semaglutide  (RYBELSUS ) 7 MG TABS [id number]  Take 1 tablet (7 mg total) by mouth daily.  Patient not taking: Reported on 10/08/2023   Zarwolo, Gloria, FNP  Active             Recommendation:   Continue Current Plan of Care  Follow Up Plan:   Telephone follow-up 2 weeks.  Fathima Bartl, RN, BSN, Theatre manager [employer] [phone]

## 2023-10-25 ENCOUNTER — Telehealth: Payer: Self-pay | Admitting: *Deleted

## 2023-10-25 ENCOUNTER — Encounter: Payer: Self-pay | Admitting: *Deleted

## 2023-10-25 NOTE — Patient Instructions (Signed)
 Clemencia LELON Sitter - I am sorry I was unable to reach you today for our scheduled appointment. I work with Zarwolo, Gloria, FNP and am calling to support your healthcare needs. Please contact me at [phone] at your earliest convenience. I look forward to speaking with you soon.   Thank you,  Rosina Forte, BSN RN [facility], [facility] RN Care Manager Direct Dial: [phone]  Fax: [phone]

## 2023-11-03 LAB — TSH+FREE T4
Free T4: 1.02 ng/dL (ref 0.82–1.77)
TSH: 10.2 u[IU]/mL — ABNORMAL HIGH (ref 0.450–4.500)

## 2023-11-05 MED ORDER — LEVOTHYROXINE SODIUM 150 MCG PO TABS: 150.0000 ug | ORAL_TABLET | Freq: Every day | ORAL | 0 refills | Status: DC

## 2023-11-12 ENCOUNTER — Encounter: Payer: Self-pay | Admitting: *Deleted

## 2023-11-12 ENCOUNTER — Telehealth: Payer: Self-pay | Admitting: *Deleted

## 2023-11-12 NOTE — Patient Instructions (Signed)
 Leslie Lindsey Sitter - I am sorry I was unable to reach you today for our scheduled appointment. I work with Zarwolo, Gloria, FNP and am calling to support your healthcare needs. Please contact me at [phone] at your earliest convenience. I look forward to speaking with you soon.   Thank you,  Rosina Forte, BSN RN [facility], [facility] RN Care Manager Direct Dial: [phone]  Fax: [phone]

## 2023-11-30 ENCOUNTER — Other Ambulatory Visit: Payer: Self-pay | Admitting: *Deleted

## 2023-11-30 NOTE — Patient Outreach (Signed)
 Complex Care Management   Visit Note  11/30/2023  Name:  Leslie Lindsey MRN: [id number] DOB: 10-30-1950  Situation: Referral received for Complex Care Management related to Diabetes with Complications and HTN I obtained verbal consent from Patient.  Visit completed with Patient  on the phone  Background:   Past Medical History:  Diagnosis Date   Asthma    Diabetes mellitus without complication (HCC)    Hypertension    Hyperthyroidism    PONV (postoperative nausea and vomiting)    Shortness of breath     Assessment: Patient Reported Symptoms:  Cognitive Cognitive Status: No symptoms reported Cognitive/Intellectual Conditions Management [RPT]: None reported or documented in medical history or problem list   Health Maintenance Behaviors: Annual physical exam Healing Pattern: Average Health Facilitated by: Rest  Neurological Neurological Review of Symptoms: Dizziness Neurological Management Strategies: Routine screening Neurological Self-Management Outcome: 3 (uncertain)  HEENT HEENT Symptoms Reported: No symptoms reported HEENT Management Strategies: Routine screening HEENT Self-Management Outcome: 4 (good)    Cardiovascular Cardiovascular Symptoms Reported: Dizziness Does patient have uncontrolled Hypertension?: Yes Is patient checking Blood Pressure at home?: Yes Patient's Recent BP reading at home: 167/78 Cardiovascular Management Strategies: Routine screening Cardiovascular Self-Management Outcome: 3 (uncertain)  Respiratory Respiratory Symptoms Reported: No symptoms reported Respiratory Management Strategies: Routine screening Respiratory Self-Management Outcome: 4 (good)  Endocrine Endocrine Symptoms Reported: No symptoms reported Is patient diabetic?: Yes Is patient checking blood sugars at home?: Yes List most recent blood sugar readings, include date and time of day: 11-29-2023 0800 71 Endocrine Self-Management Outcome: 3 (uncertain)  Gastrointestinal  Gastrointestinal Symptoms Reported: No symptoms reported Gastrointestinal Self-Management Outcome: 4 (good)    Genitourinary Genitourinary Symptoms Reported: No symptoms reported Genitourinary Self-Management Outcome: 4 (good)  Integumentary Integumentary Symptoms Reported: No symptoms reported Skin Management Strategies: Routine screening Skin Self-Management Outcome: 4 (good)  Musculoskeletal Musculoskelatal Symptoms Reviewed: Weakness Musculoskeletal Management Strategies: Routine screening, Medical device Musculoskeletal Self-Management Outcome: 3 (uncertain) Falls in the past year?: Yes Number of falls in past year: 1 or less Was there an injury with Fall?: No Fall Risk Category Calculator: 1 Patient Fall Risk Level: Low Fall Risk Patient at Risk for Falls Due to: History of fall(s) Fall risk Follow up: Falls evaluation completed, Education provided  Psychosocial Psychosocial Symptoms Reported: No symptoms reported Behavioral Management Strategies: Support system Behavioral Health Self-Management Outcome: 4 (good) Major Change/Loss/Stressor/Fears (CP): Denies Techniques to Cope with Loss/Stress/Change: Spiritual practice(s) Quality of Family Relationships: supportive Do you feel physically threatened by others?: No    11/30/2023    PHQ2-9 Depression Screening   Little interest or pleasure in doing things Not at all  Feeling down, depressed, or hopeless Several days  PHQ-2 - Total Score 1  Trouble falling or staying asleep, or sleeping too much    Feeling tired or having little energy    Poor appetite or overeating     Feeling bad about yourself - or that you are a failure or have let yourself or your family down    Trouble concentrating on things, such as reading the newspaper or watching television    Moving or speaking so slowly that other people could have noticed.  Or the opposite - being so fidgety or restless that you have been moving around a lot more than usual     Thoughts that you would be better off dead, or hurting yourself in some way    PHQ2-9 Total Score    If you checked off any problems, how difficult have  these problems made it for you to do your work, take care of things at home, or get along with other people    Depression Interventions/Treatment      Vitals:   11/30/23 1112  BP: (!) 167/78    Medications Reviewed Today     Reviewed by Bertrum Rosina HERO, RN (Registered Nurse) on 11/30/23 at 1110  Med List Status: <None>   Medication Order Taking? Sig Documenting Provider Last Dose Status Informant  acetaminophen  (TYLENOL ) 325 MG tablet [id number] Yes Take 2 tablets (650 mg total) by mouth every 6 (six) hours as needed for mild pain (pain score 1-3) or headache (or Fever >/= 101). Pearlean Manus, MD  Active   albuterol  (VENTOLIN  HFA) 108 (90 Base) MCG/ACT inhaler [id number] Yes Inhale 2 puffs into the lungs every 4 (four) hours as needed for wheezing or shortness of breath. Pearlean Manus, MD  Active   aspirin  EC 81 MG tablet [id number] Yes Take 1 tablet (81 mg total) by mouth daily with breakfast. -take Aspirin  81 mg daily along with Plavix  75 mg daily for 90 days then after that STOP the Plavix   and continue ONLY Aspirin  81 mg daily indefinitely-- Pearlean Manus, MD  Active   atorvastatin  (LIPITOR) 40 MG tablet [id number] Yes Take 1 tablet (40 mg total) by mouth daily. Pearlean Manus, MD  Active   carvedilol  (COREG ) 6.25 MG tablet [id number] Yes Take 1 tablet (6.25 mg total) by mouth 2 (two) times daily. Pearlean Manus, MD  Active   cloNIDine  (CATAPRES ) 0.2 MG tablet [id number] Yes Take 1 tablet (0.2 mg total) by mouth 2 (two) times daily as needed. Bacchus, Meade PEDLAR, FNP  Active   clopidogrel  (PLAVIX ) 75 MG tablet [id number] Yes Take 1 tablet (75 mg total) by mouth daily. -take Aspirin  81 mg daily along with Plavix  75 mg daily for 90 days then after that STOP the Plavix   and continue ONLY Aspirin  81 mg daily indefinitely-- Bacchus,  Gloria Z, FNP  Active   diphenhydrAMINE (SOMINEX) 25 MG tablet [id number] Yes Take 25 mg by mouth at bedtime as needed for allergies or itching. For allergies [provider]  Active Self, Pharmacy Records  empagliflozin  (JARDIANCE ) 10 MG TABS tablet [id number] Yes Take 1 tablet (10 mg total) by mouth daily before breakfast. Bacchus, Meade PEDLAR, FNP  Active   hydrALAZINE  (APRESOLINE ) 25 MG tablet [id number] Yes Take 1 tablet (25 mg total) by mouth 3 (three) times daily. Pearlean Manus, MD  Active   levothyroxine  (SYNTHROID ) 125 MCG tablet [id number]  Take 1 tablet (125 mcg total) by mouth daily.  Patient not taking: Reported on 11/30/2023   Edman Meade PEDLAR, FNP  Consider Medication Status and Discontinue   levothyroxine  (SYNTHROID ) 150 MCG tablet [id number] Yes Take 1 tablet (150 mcg total) by mouth daily. Edman Meade PEDLAR, FNP  Active   LORazepam  (ATIVAN ) 1 MG tablet [id number]  Take 1 mg by mouth 3 (three) times daily as needed for anxiety.  Patient not taking: Reported on 11/30/2023   [provider]  Consider Medication Status and Discontinue Self, Pharmacy Records  meclizine  (ANTIVERT ) 25 MG tablet [id number]  Take 1 tablet (25 mg total) by mouth 3 (three) times daily as needed for dizziness (vertigo).  Patient not taking: Reported on 11/30/2023   Pearlean Manus, MD  Active   metFORMIN  (GLUCOPHAGE ) 1000 MG tablet [id number] Yes Take 1 tablet (1,000 mg total) by mouth 2 (two) times daily with a meal. Bacchus, Meade PEDLAR, FNP  Active   Olmesartan -amLODIPine -HCTZ  40-10-25 MG TABS [id number] Yes Take 1 tablet by mouth daily. Edman Meade PEDLAR, FNP  Active   potassium chloride  (KLOR-CON  M) 10 MEQ tablet [id number] Yes Take 1 tablet (10 mEq total) by mouth daily. Edman Meade PEDLAR, FNP  Active   Semaglutide  (RYBELSUS ) 7 MG TABS [id number] Yes Take 1 tablet (7 mg total) by mouth daily. Bacchus, Gloria Z, FNP  Active             Recommendation:   Continue Current Plan of Care  Follow Up  Plan:   Telephone follow-up in 1 month  Rosina Forte, BSN RN [facility], [facility] RN Care Manager Direct Dial: [phone]  Fax: [phone]

## 2023-11-30 NOTE — Patient Instructions (Signed)
 Visit Information  Thank you for taking time to visit with me today. Please don't hesitate to contact me if I can be of assistance to you before our next scheduled appointment.  Your next care management appointment is by telephone on 12-30-2023 at 10:30 am  Telephone follow-up in 1 month  Please call the care guide team at [phone] if you need to cancel, schedule, or reschedule an appointment.   Please call the Suicide and Crisis Lifeline: 988 call the [place]  National Suicide Prevention Lifeline: 517-633-5452 or TTY: 6712881793 TTY 669 841 2073) to talk to a trained counselor call 1-800-273-TALK (toll free, 24 hour hotline) if you are experiencing a Mental Health or Behavioral Health Crisis or need someone to talk to.  Rosina Forte, BSN RN [facility], [facility] RN Care Manager Direct Dial: [phone]  Fax: [phone]

## 2023-12-02 ENCOUNTER — Ambulatory Visit (INDEPENDENT_AMBULATORY_CARE_PROVIDER_SITE_OTHER): Admitting: Family Medicine

## 2023-12-02 ENCOUNTER — Encounter: Payer: Self-pay | Admitting: Family Medicine

## 2023-12-02 ENCOUNTER — Institutional Professional Consult (permissible substitution) (HOSPITAL_BASED_OUTPATIENT_CLINIC_OR_DEPARTMENT_OTHER): Admitting: Family

## 2023-12-02 VITALS — BP 182/84 | HR 118 | Resp 16 | Ht 66.0 in | Wt 193.0 lb

## 2023-12-02 DIAGNOSIS — E038 Other specified hypothyroidism: Secondary | ICD-10-CM | POA: Diagnosis not present

## 2023-12-02 DIAGNOSIS — I1A0 Resistant hypertension: Secondary | ICD-10-CM | POA: Diagnosis not present

## 2023-12-02 DIAGNOSIS — E118 Type 2 diabetes mellitus with unspecified complications: Secondary | ICD-10-CM

## 2023-12-02 DIAGNOSIS — E559 Vitamin D deficiency, unspecified: Secondary | ICD-10-CM

## 2023-12-02 DIAGNOSIS — E7849 Other hyperlipidemia: Secondary | ICD-10-CM

## 2023-12-02 MED ORDER — RYBELSUS 14 MG PO TABS: 14.0000 mg | ORAL_TABLET | Freq: Every day | ORAL | 1 refills | Status: DC

## 2023-12-02 NOTE — Progress Notes (Signed)
 Established Patient Office Visit  Subjective:  Patient ID: Leslie Lindsey, female    DOB: 18-Jun-1950  Age: 73 y.o. MRN: [id number]  CC:  Chief Complaint  Patient presents with   Hypertension    Follow up     HPI Leslie Lindsey is a 73 y.o. female with past medical history of Resistant hypertension, diabetes, hyperlipidemia, hypothyroidism presents for f/u of  chronic medical conditions.   Past Medical History:  Diagnosis Date   Asthma    Diabetes mellitus without complication (HCC)    Hypertension    Hyperthyroidism    PONV (postoperative nausea and vomiting)    Shortness of breath     Past Surgical History:  Procedure Laterality Date   EYE SURGERY     30 years ago   HYSTEROSCOPY WITH D & C  11/11/2010   Procedure: DILATATION AND CURETTAGE (D&C) /HYSTEROSCOPY;  Surgeon: Norleen LULLA Server, MD;  Location: [facility];  Service: Gynecology;  Laterality: N/A;  WITH REMOVAL ENDOMETRIAL POLYP    Family History  Problem Relation Age of Onset   Anesthesia problems Neg Hx    Hypotension Neg Hx    Malignant hyperthermia Neg Hx    Pseudochol deficiency Neg Hx     Social History   Socioeconomic History   Marital status: Divorced    Spouse name: Not on file   Number of children: Not on file   Years of education: Not on file   Highest education level: Not on file  Occupational History   Not on file  Tobacco Use   Smoking status: Never   Smokeless tobacco: Never  Vaping Use   Vaping status: Never Used  Substance and Sexual Activity   Alcohol use: No   Drug use: No   Sexual activity: Yes    Birth control/protection: Post-menopausal  Other Topics Concern   Not on file  Social History Narrative   Not on file   Social Drivers of Health   Financial Resource Strain: Not on file  Food Insecurity: No Food Insecurity (10/08/2023)   Hunger Vital Sign    Worried About Running Out of Food in the Last Year: Never true    Ran Out of Food in the Last Year: Never true   Transportation Needs: No Transportation Needs (10/08/2023)   PRAPARE - Administrator, Civil Service (Medical): No    Lack of Transportation (Non-Medical): No  Physical Activity: Not on file  Stress: Not on file  Social Connections: Moderately Integrated (07/02/2023)   Social Connection and Isolation Panel    Frequency of Communication with Friends and Family: More than three times a week    Frequency of Social Gatherings with Friends and Family: Three times a week    Attends Religious Services: More than 4 times per year    Active Member of Clubs or Organizations: Yes    Attends Banker Meetings: More than 4 times per year    Marital Status: Divorced  Intimate Partner Violence: Not At Risk (10/08/2023)   Humiliation, Afraid, Rape, and Kick questionnaire    Fear of Current or Ex-Partner: No    Emotionally Abused: No    Physically Abused: No    Sexually Abused: No    Outpatient Medications Prior to Visit  Medication Sig Dispense Refill   acetaminophen  (TYLENOL ) 325 MG tablet Take 2 tablets (650 mg total) by mouth every 6 (six) hours as needed for mild pain (pain score 1-3) or headache (or Fever >/=  101).     albuterol  (VENTOLIN  HFA) 108 (90 Base) MCG/ACT inhaler Inhale 2 puffs into the lungs every 4 (four) hours as needed for wheezing or shortness of breath. 8 g 1   aspirin  EC 81 MG tablet Take 1 tablet (81 mg total) by mouth daily with breakfast. -take Aspirin  81 mg daily along with Plavix  75 mg daily for 90 days then after that STOP the Plavix   and continue ONLY Aspirin  81 mg daily indefinitely-- 30 tablet 12   atorvastatin  (LIPITOR) 40 MG tablet Take 1 tablet (40 mg total) by mouth daily. 30 tablet 5   carvedilol  (COREG ) 6.25 MG tablet Take 1 tablet (6.25 mg total) by mouth 2 (two) times daily. 60 tablet 11   cloNIDine  (CATAPRES ) 0.2 MG tablet Take 1 tablet (0.2 mg total) by mouth 2 (two) times daily as needed. 60 tablet 5   clopidogrel  (PLAVIX ) 75 MG tablet Take 1  tablet (75 mg total) by mouth daily. -take Aspirin  81 mg daily along with Plavix  75 mg daily for 90 days then after that STOP the Plavix   and continue ONLY Aspirin  81 mg daily indefinitely-- 30 tablet 2   diphenhydrAMINE (SOMINEX) 25 MG tablet Take 25 mg by mouth at bedtime as needed for allergies or itching. For allergies     empagliflozin  (JARDIANCE ) 10 MG TABS tablet Take 1 tablet (10 mg total) by mouth daily before breakfast. 30 tablet 1   hydrALAZINE  (APRESOLINE ) 25 MG tablet Take 1 tablet (25 mg total) by mouth 3 (three) times daily. 90 tablet 5   levothyroxine  (SYNTHROID ) 150 MCG tablet Take 1 tablet (150 mcg total) by mouth daily. 90 tablet 0   metFORMIN  (GLUCOPHAGE ) 1000 MG tablet Take 1 tablet (1,000 mg total) by mouth 2 (two) times daily with a meal. 180 tablet 3   Olmesartan -amLODIPine -HCTZ 40-10-25 MG TABS Take 1 tablet by mouth daily. 90 tablet 1   potassium chloride  (KLOR-CON  M) 10 MEQ tablet Take 1 tablet (10 mEq total) by mouth daily. 90 tablet 1   Semaglutide  (RYBELSUS ) 7 MG TABS Take 1 tablet (7 mg total) by mouth daily. 30 tablet 1   levothyroxine  (SYNTHROID ) 125 MCG tablet Take 1 tablet (125 mcg total) by mouth daily. (Patient not taking: Reported on 12/02/2023) 90 tablet 3   LORazepam  (ATIVAN ) 1 MG tablet Take 1 mg by mouth 3 (three) times daily as needed for anxiety. (Patient not taking: Reported on 12/02/2023)     meclizine  (ANTIVERT ) 25 MG tablet Take 1 tablet (25 mg total) by mouth 3 (three) times daily as needed for dizziness (vertigo). (Patient not taking: Reported on 12/02/2023) 30 tablet 0   No facility-administered medications prior to visit.    Allergies  Allergen Reactions   Peanut-Containing Drug Products Anaphylaxis    ROS Review of Systems  Constitutional:  Negative for chills and fever.  Eyes:  Negative for visual disturbance.  Respiratory:  Negative for chest tightness and shortness of breath.   Neurological:  Negative for dizziness and headaches.       Objective:    Physical Exam HENT:     Head: Normocephalic.     Mouth/Throat:     Mouth: Mucous membranes are moist.  Cardiovascular:     Rate and Rhythm: Normal rate.     Heart sounds: Normal heart sounds.  Pulmonary:     Effort: Pulmonary effort is normal.     Breath sounds: Normal breath sounds.  Neurological:     Mental Status: She is alert.  BP (!) 182/84   Pulse (!) 118   Resp 16   Ht 5' 6 (1.676 m)   Wt 193 lb 0.6 oz (87.6 kg)   SpO2 95%   BMI 31.16 kg/m  Wt Readings from Last 3 Encounters:  12/02/23 193 lb 0.6 oz (87.6 kg)  10/06/23 193 lb (87.5 kg)  09/20/23 195 lb (88.5 kg)    Lab Results  Component Value Date   TSH 10.200 (H) 11/02/2023   Lab Results  Component Value Date   WBC 6.7 09/20/2023   HGB 12.9 09/20/2023   HCT 40.2 09/20/2023   MCV 89 09/20/2023   PLT 289 09/20/2023   Lab Results  Component Value Date   NA 140 09/20/2023   K 4.5 09/20/2023   CO2 23 09/20/2023   GLUCOSE 133 (H) 09/20/2023   BUN 18 09/20/2023   CREATININE 1.05 (H) 09/20/2023   BILITOT 0.5 09/20/2023   ALKPHOS 89 09/20/2023   AST 25 09/20/2023   ALT 23 09/20/2023   PROT 7.8 09/20/2023   ALBUMIN 4.6 09/20/2023   CALCIUM  10.7 (H) 09/20/2023   ANIONGAP 10 07/02/2023   EGFR 57 (L) 09/20/2023   Lab Results  Component Value Date   CHOL 211 (H) 09/20/2023   Lab Results  Component Value Date   HDL 84 09/20/2023   Lab Results  Component Value Date   LDLCALC 103 (H) 09/20/2023   Lab Results  Component Value Date   TRIG 141 09/20/2023   Lab Results  Component Value Date   CHOLHDL 2.5 09/20/2023   Lab Results  Component Value Date   HGBA1C 8.1 (H) 09/20/2023      Assessment & Plan:  Resistant hypertension Assessment & Plan: Uncontrolled BP. Reports that ambulatory readings are stable. The patient is asymptomatic. Low sodium diet and increased physical activity encouraged. Encouraged to bring her ambulatory readings to her next  appointment. Advised to follow up in the ED for consistent BP >180/120 with symptoms of chest pain, palpitations, or blurred vision.    Diabetes mellitus type 2 with complications [facility]) Assessment & Plan: Encouraged to continue taking Metformin  1000 mg BID and Rybelsus  14 mg daily. Encouraged to decrease her intake of high-sugar foods and beverages, with increased physical activity   Orders: -     Rybelsus ; Take 1 tablet (14 mg total) by mouth daily.  Dispense: 90 tablet; Refill: 1 -     Hemoglobin A1c  Vitamin D  deficiency -     VITAMIN D  25 Hydroxy (Vit-D Deficiency, Fractures)  TSH (thyroid-stimulating hormone deficiency) -     TSH + free T4  Other hyperlipidemia -     Lipid panel -     CMP14+EGFR -     CBC with Differential/Platelet  Note: This chart has been completed using Engineer, civil (consulting) software, and while attempts have been made to ensure accuracy, certain words and phrases may not be transcribed as intended.    Follow-up: Return in about 4 months (around 04/03/2024).   Nazarene Bunning  Z Bacchus, FNP

## 2023-12-02 NOTE — Patient Instructions (Addendum)
 I appreciate the opportunity to provide care to you today!    Follow up:  4 months  Fasting Labs: please stop by the lab mid November 2025 to get your blood drawn (CBC, CMP, TSH, Lipid profile, HgA1c, Vit D)  For a Healthier YOU, I Recommend: Reducing your intake of sugar, sodium, carbohydrates, and saturated fats. Increasing your fiber intake by incorporating more whole grains, fruits, and vegetables into your meals. Setting healthy goals with a focus on lowering your consumption of carbs, sugar, and unhealthy fats. Adding variety to your diet by including a wide range of fruits and vegetables. Cutting back on soda and limiting processed foods as much as possible. Staying active: In addition to taking your weight loss medication, aim for at least 150 minutes of moderate-intensity physical activity each week for optimal results.    Please follow up if your symptoms worsen or fail to improve.     Please continue to a heart-healthy diet and increase your physical activities. Try to exercise for at least five days a week.    It was a pleasure to see you and I look forward to continuing to work together on your health and well-being. Please do not hesitate to call the office if you need care or have questions about your care.  In case of emergency, please visit the Emergency Department for urgent care, or contact our clinic at [phone] to schedule an appointment. We're here to help you!   Have a wonderful day and week. With Gratitude, Meade JENEANE Gerlach MSN, FNP-BC, PMHNP-BC

## 2023-12-04 NOTE — Assessment & Plan Note (Signed)
 Encouraged to continue taking Metformin  1000 mg BID and Rybelsus  14 mg daily. Encouraged to decrease her intake of high-sugar foods and beverages, with increased physical activity

## 2023-12-04 NOTE — Assessment & Plan Note (Signed)
 Uncontrolled BP. Reports that ambulatory readings are stable. The patient is asymptomatic. Low sodium diet and increased physical activity encouraged. Encouraged to bring her ambulatory readings to her next appointment. Advised to follow up in the ED for consistent BP >180/120 with symptoms of chest pain, palpitations, or blurred vision.

## 2023-12-20 ENCOUNTER — Ambulatory Visit: Admitting: Neurology

## 2023-12-30 ENCOUNTER — Other Ambulatory Visit: Payer: Self-pay | Admitting: *Deleted

## 2023-12-30 NOTE — Patient Instructions (Signed)
 Visit Information  Thank you for taking time to visit with me today. Please don't hesitate to contact me if I can be of assistance to you before our next scheduled appointment.  Your next care management appointment is by telephone on 02-01-2024 at 10:00 am  Telephone follow-up in 1 month  Please call the care guide team at [phone] if you need to cancel, schedule, or reschedule an appointment.   Please call the Suicide and Crisis Lifeline: 988 call the [place]  National Suicide Prevention Lifeline: (520) 285-9109 or TTY: (609) 088-2753 TTY (918)039-9213) to talk to a trained counselor call 1-800-273-TALK (toll free, 24 hour hotline) if you are experiencing a Mental Health or Behavioral Health Crisis or need someone to talk to.  Rosina Forte, BSN RN [facility], [facility] RN Care Manager Direct Dial: [phone]  Fax: [phone]

## 2023-12-30 NOTE — Patient Outreach (Signed)
 Complex Care Management   Visit Note  12/30/2023  Name:  Leslie Lindsey MRN: [id number] DOB: 08-May-1950  Situation: Referral received for Complex Care Management related to Diabetes with Complications and HTN I obtained verbal consent from Patient.  Visit completed with Patient  on the phone  Background:   Past Medical History:  Diagnosis Date   Asthma    Diabetes mellitus without complication (HCC)    Hypertension    Hyperthyroidism    PONV (postoperative nausea and vomiting)    Shortness of breath     Assessment: Patient Reported Symptoms:  Cognitive Cognitive Status: No symptoms reported Cognitive/Intellectual Conditions Management [RPT]: None reported or documented in medical history or problem list   Health Maintenance Behaviors: Annual physical exam Healing Pattern: Average Health Facilitated by: Rest  Neurological Neurological Review of Symptoms: No symptoms reported    HEENT HEENT Symptoms Reported: No symptoms reported      Cardiovascular Cardiovascular Symptoms Reported: No symptoms reported Does patient have uncontrolled Hypertension?: Yes Is patient checking Blood Pressure at home?: Yes Patient's Recent BP reading at home: 148/84 (patient reported) Cardiovascular Self-Management Outcome: 3 (uncertain)  Respiratory Respiratory Symptoms Reported: No symptoms reported    Endocrine Endocrine Symptoms Reported: No symptoms reported Is patient diabetic?: Yes Is patient checking blood sugars at home?: No Endocrine Self-Management Outcome: 3 (uncertain)  Gastrointestinal Gastrointestinal Symptoms Reported: No symptoms reported Gastrointestinal Self-Management Outcome: 4 (good)    Genitourinary Genitourinary Symptoms Reported: No symptoms reported    Integumentary Integumentary Symptoms Reported: No symptoms reported    Musculoskeletal Musculoskelatal Symptoms Reviewed: No symptoms reported   Falls in the past year?: Yes Number of falls in past year: 1 or  less Was there an injury with Fall?: No Fall Risk Category Calculator: 1 Patient Fall Risk Level: Low Fall Risk Patient at Risk for Falls Due to: History of fall(s) Fall risk Follow up: Falls evaluation completed, Education provided  Psychosocial Psychosocial Symptoms Reported: No symptoms reported Behavioral Health Self-Management Outcome: 4 (good) Major Change/Loss/Stressor/Fears (CP): Denies Techniques to Cope with Loss/Stress/Change: Spiritual practice(s) Quality of Family Relationships: supportive Do you feel physically threatened by others?: No    12/30/2023    PHQ2-9 Depression Screening   Little interest or pleasure in doing things Not at all  Feeling down, depressed, or hopeless Not at all  PHQ-2 - Total Score 0  Trouble falling or staying asleep, or sleeping too much    Feeling tired or having little energy    Poor appetite or overeating     Feeling bad about yourself - or that you are a failure or have let yourself or your family down    Trouble concentrating on things, such as reading the newspaper or watching television    Moving or speaking so slowly that other people could have noticed.  Or the opposite - being so fidgety or restless that you have been moving around a lot more than usual    Thoughts that you would be better off dead, or hurting yourself in some way    PHQ2-9 Total Score    If you checked off any problems, how difficult have these problems made it for you to do your work, take care of things at home, or get along with other people    Depression Interventions/Treatment      Vitals:   12/30/23 1019  BP: (!) 148/84    Medications Reviewed Today     Reviewed by Bertrum Rosina HERO, RN (Registered Nurse) on 12/30/23 at  1015  Med List Status: <None>   Medication Order Taking? Sig Documenting Provider Last Dose Status Informant  acetaminophen  (TYLENOL ) 325 MG tablet [id number]  Take 2 tablets (650 mg total) by mouth every 6 (six) hours as needed for mild  pain (pain score 1-3) or headache (or Fever >/= 101). Pearlean Manus, MD  Active   albuterol  (VENTOLIN  HFA) 108 (90 Base) MCG/ACT inhaler [id number]  Inhale 2 puffs into the lungs every 4 (four) hours as needed for wheezing or shortness of breath. Pearlean Manus, MD  Active   aspirin  EC 81 MG tablet [id number]  Take 1 tablet (81 mg total) by mouth daily with breakfast. -take Aspirin  81 mg daily along with Plavix  75 mg daily for 90 days then after that STOP the Plavix   and continue ONLY Aspirin  81 mg daily indefinitely-- Pearlean Manus, MD  Active   atorvastatin  (LIPITOR) 40 MG tablet [id number]  Take 1 tablet (40 mg total) by mouth daily. Pearlean Manus, MD  Active   carvedilol  (COREG ) 6.25 MG tablet [id number]  Take 1 tablet (6.25 mg total) by mouth 2 (two) times daily. Pearlean Manus, MD  Active   cloNIDine  (CATAPRES ) 0.2 MG tablet [id number]  Take 1 tablet (0.2 mg total) by mouth 2 (two) times daily as needed. Edman Meade PEDLAR, FNP  Active   diphenhydrAMINE (SOMINEX) 25 MG tablet [id number]  Take 25 mg by mouth at bedtime as needed for allergies or itching. For allergies [provider]  Active Self, Pharmacy Records  empagliflozin  (JARDIANCE ) 10 MG TABS tablet [id number]  Take 1 tablet (10 mg total) by mouth daily before breakfast. Bacchus, Meade PEDLAR, FNP  Active   hydrALAZINE  (APRESOLINE ) 25 MG tablet [id number]  Take 1 tablet (25 mg total) by mouth 3 (three) times daily. Pearlean Manus, MD  Active   levothyroxine  (SYNTHROID ) 125 MCG tablet [id number]  Take 1 tablet (125 mcg total) by mouth daily.  Patient not taking: Reported on 12/02/2023   Edman Meade PEDLAR, FNP  Active   levothyroxine  (SYNTHROID ) 150 MCG tablet [id number]  Take 1 tablet (150 mcg total) by mouth daily. Edman Meade PEDLAR, FNP  Active   LORazepam  (ATIVAN ) 1 MG tablet [id number]  Take 1 mg by mouth 3 (three) times daily as needed for anxiety.  Patient not taking: Reported on 12/02/2023   [provider]  Active  Self, Pharmacy Records  meclizine  (ANTIVERT ) 25 MG tablet [id number]  Take 1 tablet (25 mg total) by mouth 3 (three) times daily as needed for dizziness (vertigo).  Patient not taking: Reported on 12/02/2023   Pearlean Manus, MD  Active   metFORMIN  (GLUCOPHAGE ) 1000 MG tablet [id number]  Take 1 tablet (1,000 mg total) by mouth 2 (two) times daily with a meal. Bacchus, Meade PEDLAR, FNP  Active   Olmesartan -amLODIPine -HCTZ 40-10-25 MG TABS [id number] Yes Take 1 tablet by mouth daily. Edman Meade PEDLAR, FNP  Active   potassium chloride  (KLOR-CON  M) 10 MEQ tablet [id number]  Take 1 tablet (10 mEq total) by mouth daily. Edman Meade PEDLAR, FNP  Active   Semaglutide  (RYBELSUS ) 14 MG TABS [id number]  Take 1 tablet (14 mg total) by mouth daily. Bacchus, Gloria Z, FNP  Active             Recommendation:   Continue Current Plan of Care  Follow Up Plan:   Telephone follow-up in 1 month  Rosina Forte, BSN RN [facility], [facility] RN Care Manager Direct Dial: [phone]  Fax: [phone]

## 2024-01-05 ENCOUNTER — Other Ambulatory Visit: Payer: Self-pay

## 2024-01-05 DIAGNOSIS — E118 Type 2 diabetes mellitus with unspecified complications: Secondary | ICD-10-CM

## 2024-01-05 DIAGNOSIS — I1A0 Resistant hypertension: Secondary | ICD-10-CM

## 2024-01-05 DIAGNOSIS — E1165 Type 2 diabetes mellitus with hyperglycemia: Secondary | ICD-10-CM

## 2024-01-05 DIAGNOSIS — E876 Hypokalemia: Secondary | ICD-10-CM

## 2024-01-05 DIAGNOSIS — E038 Other specified hypothyroidism: Secondary | ICD-10-CM

## 2024-01-05 MED ORDER — ASPIRIN 81 MG PO TBEC: 81.0000 mg | DELAYED_RELEASE_TABLET | Freq: Every day | ORAL | 12 refills | Status: AC

## 2024-01-05 MED ORDER — RYBELSUS 14 MG PO TABS: 14.0000 mg | ORAL_TABLET | Freq: Every day | ORAL | 1 refills | Status: AC

## 2024-01-05 MED ORDER — ATORVASTATIN CALCIUM 40 MG PO TABS: 40.0000 mg | ORAL_TABLET | Freq: Every day | ORAL | 5 refills | Status: DC

## 2024-01-05 MED ORDER — HYDRALAZINE HCL 25 MG PO TABS
25.0000 mg | ORAL_TABLET | Freq: Three times a day (TID) | ORAL | 5 refills | Status: AC
Start: 2024-01-05 — End: 2025-01-04

## 2024-01-05 MED ORDER — CARVEDILOL 6.25 MG PO TABS: 6.2500 mg | ORAL_TABLET | Freq: Two times a day (BID) | ORAL | 11 refills | Status: DC

## 2024-01-05 MED ORDER — CLONIDINE HCL 0.2 MG PO TABS: 0.2000 mg | ORAL_TABLET | Freq: Two times a day (BID) | ORAL | 5 refills | Status: DC | PRN

## 2024-01-05 MED ORDER — POTASSIUM CHLORIDE CRYS ER 10 MEQ PO TBCR: 10.0000 meq | EXTENDED_RELEASE_TABLET | Freq: Every day | ORAL | 1 refills | Status: AC

## 2024-01-05 MED ORDER — ALBUTEROL SULFATE HFA 108 (90 BASE) MCG/ACT IN AERS: 2.0000 | INHALATION_SPRAY | RESPIRATORY_TRACT | 1 refills | Status: AC | PRN

## 2024-01-05 MED ORDER — LEVOTHYROXINE SODIUM 150 MCG PO TABS: 150.0000 ug | ORAL_TABLET | Freq: Every day | ORAL | 0 refills | Status: DC

## 2024-01-05 MED ORDER — OLMESARTAN-AMLODIPINE-HCTZ 40-10-25 MG PO TABS: 1.0000 | ORAL_TABLET | Freq: Every day | ORAL | 1 refills | Status: AC

## 2024-01-05 MED ORDER — EMPAGLIFLOZIN 10 MG PO TABS: 10.0000 mg | ORAL_TABLET | Freq: Every day | ORAL | 1 refills | Status: DC

## 2024-01-05 MED ORDER — METFORMIN HCL 1000 MG PO TABS: 1000.0000 mg | ORAL_TABLET | Freq: Two times a day (BID) | ORAL | 3 refills | Status: AC

## 2024-01-06 ENCOUNTER — Ambulatory Visit

## 2024-01-06 VITALS — BP 133/80 | Ht 66.0 in | Wt 190.0 lb

## 2024-01-06 DIAGNOSIS — Z1231 Encounter for screening mammogram for malignant neoplasm of breast: Secondary | ICD-10-CM

## 2024-01-06 DIAGNOSIS — Z0000 Encounter for general adult medical examination without abnormal findings: Secondary | ICD-10-CM

## 2024-01-06 DIAGNOSIS — Z780 Asymptomatic menopausal state: Secondary | ICD-10-CM

## 2024-01-06 NOTE — Progress Notes (Signed)
 I connected with  Leslie Lindsey on 01/06/24 by a audio enabled telemedicine application and verified that I am speaking with the correct person using two identifiers.  Patient Location: Home  Provider Location: Office/Clinic  I discussed the limitations of evaluation and management by telemedicine. The patient expressed understanding and agreed to proceed.  Patient states she completed a cologuard through [facility] and they should have the results. She also had retinal exam when insurance came   Subjective:   Leslie Lindsey is a 73 y.o. female who presents for a Medicare Annual Wellness Visit.  Allergies (verified) Peanut-containing drug products   History: Past Medical History:  Diagnosis Date   Asthma    Diabetes mellitus without complication (HCC)    Hypertension    Hyperthyroidism    PONV (postoperative nausea and vomiting)    Shortness of breath    Past Surgical History:  Procedure Laterality Date   EYE SURGERY     30 years ago   HYSTEROSCOPY WITH D & C  11/11/2010   Procedure: DILATATION AND CURETTAGE (D&C) /HYSTEROSCOPY;  Surgeon: Norleen LULLA Server, MD;  Location: [facility];  Service: Gynecology;  Laterality: N/A;  WITH REMOVAL ENDOMETRIAL POLYP   Family History  Problem Relation Age of Onset   Anesthesia problems Neg Hx    Hypotension Neg Hx    Malignant hyperthermia Neg Hx    Pseudochol deficiency Neg Hx    Social History   Occupational History   Not on file  Tobacco Use   Smoking status: Never   Smokeless tobacco: Never  Vaping Use   Vaping status: Never Used  Substance and Sexual Activity   Alcohol use: No   Drug use: No   Sexual activity: Yes    Birth control/protection: Post-menopausal   Tobacco Counseling Counseling given: Not Answered  SDOH Screenings   Food Insecurity: No Food Insecurity (10/08/2023)  Housing: Low Risk  (10/08/2023)  Transportation Needs: No Transportation Needs (10/08/2023)  Utilities: Not At Risk (10/08/2023)  Depression  (PHQ2-9): Low Risk  (12/30/2023)  Recent Concern: Depression (PHQ2-9) - Medium Risk (12/02/2023)  Social Connections: Moderately Integrated (07/02/2023)  Tobacco Use: Low Risk  (01/06/2024)   Depression Screen    12/30/2023   10:22 AM 12/02/2023    3:21 PM 11/30/2023   11:17 AM 10/08/2023    9:56 AM 09/20/2023    2:45 PM 05/11/2022    3:51 PM  PHQ 2/9 Scores  PHQ - 2 Score 0 2 1 1 2 2   PHQ- 9 Score  8   4  9  2       Data saved with a previous flowsheet row definition     Goals Addressed               This Visit's Progress     Remain active and healthy (pt-stated)         Visit info / Clinical Intake: Medicare Wellness Visit Type:: Initial Annual Wellness Visit Medicare Wellness Visit Mode:: Telephone If telephone:: video declined If telephone or video:: pt reported vitals Interpreter Needed?: No Pre-visit prep was completed: yes AWV questionnaire completed by patient prior to visit?: no Living arrangements:: with family/others Typical amount of pain: none Does pain affect daily life?: no Are you currently prescribed opioids?: no  Dietary Habits and Nutritional Risks How many meals a day?: 3 Eats fruit and vegetables daily?: yes Most meals are obtained by: preparing own meals Diabetic:: (!) yes Any non-healing wounds?: no How often do you check  your BS?: 2 Would you like to be referred to a Nutritionist or for Diabetic Management? : no  Functional Status Activities of Daily Living (to include ambulation/medication): Independent Ambulation: Independent with device- listed below Home Assistive Devices/Equipment: [place] (uses a cane as needed) Medication Administration: Independent Home Management: Independent Manage your own finances?: yes  Fall Screening Falls in the past year?: 1 Number of falls in past year: 0 Was there an injury with Fall?: 0 Fall Risk Category Calculator: 1 Patient Fall Risk Level: Low Fall Risk  Fall Risk Patient at Risk for Falls Due to:  History of fall(s) Fall risk Follow up: Falls evaluation completed; Education provided  Advance Directives (For Healthcare) Does Patient Have a Medical Advance Directive?: No Would patient like information on creating a medical advance directive?: Yes (MAU/Ambulatory/Procedural Areas - Information given) (Patient has requested information to be sent)        Objective:    Today's Vitals   01/06/24 1309  BP: 133/80  Weight: 190 lb (86.2 kg)  Height: 5' 6 (1.676 m)   Body mass index is 30.67 kg/m.  Current Medications (verified) Outpatient Encounter Medications as of 01/06/2024  Medication Sig   acetaminophen  (TYLENOL ) 325 MG tablet Take 2 tablets (650 mg total) by mouth every 6 (six) hours as needed for mild pain (pain score 1-3) or headache (or Fever >/= 101).   albuterol  (VENTOLIN  HFA) 108 (90 Base) MCG/ACT inhaler Inhale 2 puffs into the lungs every 4 (four) hours as needed for wheezing or shortness of breath.   aspirin  EC 81 MG tablet Take 1 tablet (81 mg total) by mouth daily with breakfast. -take Aspirin  81 mg daily along with Plavix  75 mg daily for 90 days then after that STOP the Plavix   and continue ONLY Aspirin  81 mg daily indefinitely--   atorvastatin  (LIPITOR) 40 MG tablet Take 1 tablet (40 mg total) by mouth daily.   carvedilol  (COREG ) 6.25 MG tablet Take 1 tablet (6.25 mg total) by mouth 2 (two) times daily.   cloNIDine  (CATAPRES ) 0.2 MG tablet Take 1 tablet (0.2 mg total) by mouth 2 (two) times daily as needed.   diphenhydrAMINE (SOMINEX) 25 MG tablet Take 25 mg by mouth at bedtime as needed for allergies or itching. For allergies   hydrALAZINE  (APRESOLINE ) 25 MG tablet Take 1 tablet (25 mg total) by mouth 3 (three) times daily.   levothyroxine  (SYNTHROID ) 150 MCG tablet Take 1 tablet (150 mcg total) by mouth daily.   metFORMIN  (GLUCOPHAGE ) 1000 MG tablet Take 1 tablet (1,000 mg total) by mouth 2 (two) times daily with a meal. (Patient taking differently: Take 1,000 mg  by mouth daily.)   Olmesartan -amLODIPine -HCTZ 40-10-25 MG TABS Take 1 tablet by mouth daily.   potassium chloride  (KLOR-CON  M) 10 MEQ tablet Take 1 tablet (10 mEq total) by mouth daily.   Semaglutide  (RYBELSUS ) 14 MG TABS Take 1 tablet (14 mg total) by mouth daily.   empagliflozin  (JARDIANCE ) 10 MG TABS tablet Take 1 tablet (10 mg total) by mouth daily before breakfast. (Patient not taking: Reported on 01/06/2024)   levothyroxine  (SYNTHROID ) 125 MCG tablet Take 1 tablet (125 mcg total) by mouth daily. (Patient not taking: Reported on 12/02/2023)   [DISCONTINUED] LORazepam  (ATIVAN ) 1 MG tablet Take 1 mg by mouth 3 (three) times daily as needed for anxiety. (Patient not taking: Reported on 01/06/2024)   [DISCONTINUED] meclizine  (ANTIVERT ) 25 MG tablet Take 1 tablet (25 mg total) by mouth 3 (three) times daily as needed for dizziness (vertigo). (  Patient not taking: Reported on 01/06/2024)   No facility-administered encounter medications on file as of 01/06/2024.   Hearing/Vision screen Hearing Screening - Comments:: Patient denies any hearing difficulties.   Vision Screening - Comments:: Wears rx glasses - up to date with routine eye exams   Immunizations and Health Maintenance Health Maintenance  Topic Date Due   Medicare Annual Wellness (AWV)  Never done   FOOT EXAM  Never done   OPHTHALMOLOGY EXAM  Never done   Diabetic kidney evaluation - Urine ACR  Never done   DTaP/Tdap/Td (1 - Tdap) Never done   Pneumococcal Vaccine: 50+ Years (1 of 2 - PCV) Never done   Mammogram  Never done   Colonoscopy  Never done   Zoster Vaccines- Shingrix (1 of 2) Never done   DEXA SCAN  Never done   COVID-19 Vaccine (3 - 2025-26 season) 11/01/2023   Influenza Vaccine  05/30/2024 (Originally 10/01/2023)   HEMOGLOBIN A1C  03/22/2024   Diabetic kidney evaluation - eGFR measurement  09/19/2024   Hepatitis C Screening  Completed   Meningococcal B Vaccine  Aged Out        Assessment/Plan:  This is a routine  wellness examination for Shalisha.  Patient Care Team: Bacchus, Meade PEDLAR, FNP as PCP - General (Family Medicine) Bertrum Rosina HERO, RN as [facility] Care Management  I have personally reviewed and noted the following in the patient's chart:   Medical and social history Use of alcohol, tobacco or illicit drugs  Current medications and supplements including opioid prescriptions. Functional ability and status Nutritional status Physical activity Advanced directives List of other physicians Hospitalizations, surgeries, and ER visits in previous 12 months Vitals Screenings to include cognitive, depression, and falls Referrals and appointments  Orders Placed This Encounter  Procedures   DG Bone Density    Standing Status:   Future    Expiration Date:   01/05/2025    Reason for Exam (SYMPTOM  OR DIAGNOSIS REQUIRED):   post menopausal estrogen deficient    Preferred imaging location?:   [facility]   MM 3D SCREENING MAMMOGRAM BILATERAL BREAST    Standing Status:   Future    Expiration Date:   01/05/2025    Reason for Exam (SYMPTOM  OR DIAGNOSIS REQUIRED):   breast cancer screening    Preferred imaging location?:   [facility]   In addition, I have reviewed and discussed with patient certain preventive protocols, quality metrics, and best practice recommendations. A written personalized care plan for preventive services as well as general preventive health recommendations were provided to patient.   Robby Bulkley, CMA   01/06/2024   No follow-ups on file.  After Visit Summary: (Mail) Due to this being a telephonic visit, the after visit summary with patients personalized plan was offered to patient via mail   Nurse Notes:

## 2024-01-06 NOTE — Patient Instructions (Signed)
 Ms. Mcilvaine,  Thank you for taking the time for your Medicare Wellness Visit. I appreciate your continued commitment to your health goals. Please review the care plan we discussed, and feel free to reach out if I can assist you further.  Please note that Annual Wellness Visits do not include a physical exam. Some assessments may be limited, especially if the visit was conducted virtually. If needed, we may recommend an in-person follow-up with your provider.  Ongoing Care Seeing your primary care provider every 3 to 6 months helps us  monitor your health and provide consistent, personalized care.   Referrals If a referral was made during today's visit and you haven't received any updates within two weeks, please contact the referred provider directly to check on the status.  Recommended Screenings:  Health Maintenance  Topic Date Due   Complete foot exam   Never done   Eye exam for diabetics  Never done   Yearly kidney health urinalysis for diabetes  Never done   DTaP/Tdap/Td vaccine (1 - Tdap) Never done   Pneumococcal Vaccine for age over 82 (1 of 2 - PCV) Never done   Breast Cancer Screening  Never done   Colon Cancer Screening  Never done   Zoster (Shingles) Vaccine (1 of 2) Never done   DEXA scan (bone density measurement)  Never done   COVID-19 Vaccine (3 - 2025-26 season) 11/01/2023   Flu Shot  05/30/2024*   Hemoglobin A1C  03/22/2024   Yearly kidney function blood test for diabetes  09/19/2024   Medicare Annual Wellness Visit  01/05/2025   Hepatitis C Screening  Completed   Meningitis B Vaccine  Aged Out  *Topic was postponed. The date shown is not the original due date.       01/06/2024    1:49 PM  Advanced Directives  Does Patient Have a Medical Advance Directive? No  Would patient like information on creating a medical advance directive? No - Patient declined    Vision: Annual vision screenings are recommended for early detection of glaucoma, cataracts, and  diabetic retinopathy. These exams can also reveal signs of chronic conditions such as diabetes and high blood pressure.  Dental: Annual dental screenings help detect early signs of oral cancer, gum disease, and other conditions linked to overall health, including heart disease and diabetes.  Please see the attached documents for additional preventive care recommendations.

## 2024-01-07 ENCOUNTER — Telehealth: Payer: Self-pay | Admitting: Family Medicine

## 2024-01-07 NOTE — Telephone Encounter (Signed)
Patient reported not taking.

## 2024-01-07 NOTE — Telephone Encounter (Signed)
 Copied from CRM [id number]. Topic: General - Other >> Jan 07, 2024  9:34 AM Zebedee SAUNDERS wrote: Reason for CRM: Received call from [facility] Delivery per [place] Phone: [phone] Fax: [phone] regarding levothyroxine  (SYNTHROID ) 150 MCG tablet the manufacture approval. Fax was sent today.

## 2024-01-10 ENCOUNTER — Telehealth: Payer: Self-pay

## 2024-01-10 NOTE — Telephone Encounter (Signed)
 Copied from CRM [id number]. Topic: Clinical - Prescription Issue >> Jan 10, 2024 12:25 PM Delon DASEN wrote: Reason for CRM: levothyroxine  (SYNTHROID ) 150 MCG tablet- [place] with [facility] needs approval for Manufacturer change- [phone]

## 2024-01-10 NOTE — Telephone Encounter (Signed)
 Form in provider box to sign off that patient is no longer taking this medication

## 2024-01-18 LAB — LIPID PANEL

## 2024-01-19 ENCOUNTER — Other Ambulatory Visit (HOSPITAL_COMMUNITY)

## 2024-01-19 ENCOUNTER — Ambulatory Visit (HOSPITAL_COMMUNITY)

## 2024-01-19 LAB — CMP14+EGFR
ALT: 14 IU/L (ref 0–32)
AST: 21 IU/L (ref 0–40)
Albumin: 4.6 g/dL (ref 3.8–4.8)
Alkaline Phosphatase: 69 IU/L (ref 49–135)
BUN/Creatinine Ratio: 26 (ref 12–28)
BUN: 28 mg/dL — AB (ref 8–27)
Bilirubin Total: 0.4 mg/dL (ref 0.0–1.2)
CO2: 21 mmol/L (ref 20–29)
Calcium: 10.5 mg/dL — AB (ref 8.7–10.3)
Chloride: 102 mmol/L (ref 96–106)
Creatinine, Ser: 1.08 mg/dL — AB (ref 0.57–1.00)
Globulin, Total: 3 g/dL (ref 1.5–4.5)
Glucose: 94 mg/dL (ref 70–99)
Potassium: 4.8 mmol/L (ref 3.5–5.2)
Sodium: 139 mmol/L (ref 134–144)
Total Protein: 7.6 g/dL (ref 6.0–8.5)
eGFR: 55 mL/min/1.73 — AB (ref >59)

## 2024-01-19 LAB — CBC WITH DIFFERENTIAL/PLATELET
Basophils Absolute: 0 x10E3/uL (ref 0.0–0.2)
Basos: 0 %
EOS (ABSOLUTE): 0.2 x10E3/uL (ref 0.0–0.4)
Eos: 3 %
Hematocrit: 38.1 % (ref 34.0–46.6)
Hemoglobin: 12.5 g/dL (ref 11.1–15.9)
Immature Grans (Abs): 0 x10E3/uL (ref 0.0–0.1)
Immature Granulocytes: 0 %
Lymphocytes Absolute: 2.2 x10E3/uL (ref 0.7–3.1)
Lymphs: 31 %
MCH: 28.9 pg (ref 26.6–33.0)
MCHC: 32.8 g/dL (ref 31.5–35.7)
MCV: 88 fL (ref 79–97)
Monocytes Absolute: 0.5 x10E3/uL (ref 0.1–0.9)
Monocytes: 7 %
Neutrophils Absolute: 4.1 x10E3/uL (ref 1.4–7.0)
Neutrophils: 59 %
Platelets: 340 x10E3/uL (ref 150–450)
RBC: 4.32 x10E6/uL (ref 3.77–5.28)
RDW: 13.1 % (ref 11.7–15.4)
WBC: 7 x10E3/uL (ref 3.4–10.8)

## 2024-01-19 LAB — HEMOGLOBIN A1C
Est. average glucose Bld gHb Est-mCnc: 137 mg/dL
Hgb A1c MFr Bld: 6.4 % — ABNORMAL HIGH (ref 4.8–5.6)

## 2024-01-19 LAB — LIPID PANEL
Cholesterol, Total: 197 mg/dL (ref 100–199)
HDL: 77 mg/dL (ref >39)
LDL CALC COMMENT:: 2.6 ratio (ref 0.0–4.4)
LDL Chol Calc (NIH): 102 mg/dL — AB (ref 0–99)
Triglycerides: 103 mg/dL (ref 0–149)
VLDL Cholesterol Cal: 18 mg/dL (ref 5–40)

## 2024-01-19 LAB — TSH+FREE T4
Free T4: 1.37 ng/dL (ref 0.82–1.77)
TSH: 1.28 u[IU]/mL (ref 0.450–4.500)

## 2024-01-19 LAB — VITAMIN D 25 HYDROXY (VIT D DEFICIENCY, FRACTURES): Vit D, 25-Hydroxy: 35.3 ng/mL (ref 30.0–100.0)

## 2024-01-31 ENCOUNTER — Ambulatory Visit: Payer: Self-pay | Admitting: Family Medicine

## 2024-01-31 ENCOUNTER — Ambulatory Visit (HOSPITAL_COMMUNITY)
Admission: RE | Admit: 2024-01-31 | Discharge: 2024-01-31 | Disposition: A | Source: Ambulatory Visit | Attending: Family Medicine

## 2024-01-31 DIAGNOSIS — Z780 Asymptomatic menopausal state: Secondary | ICD-10-CM | POA: Diagnosis present

## 2024-01-31 DIAGNOSIS — Z1231 Encounter for screening mammogram for malignant neoplasm of breast: Secondary | ICD-10-CM | POA: Diagnosis present

## 2024-01-31 NOTE — Progress Notes (Signed)
 Please inform the patient that her  bone density results were normal. Based on current recommendations, adults generally should aim for a total calcium  intake (food + supplement) of about 1,000-1,200 mg/day and vitamin D  of approximately 800 IU/day for optimal bone health

## 2024-02-01 ENCOUNTER — Other Ambulatory Visit: Payer: Self-pay | Admitting: *Deleted

## 2024-02-01 VITALS — BP 134/75

## 2024-02-01 DIAGNOSIS — I1A0 Resistant hypertension: Secondary | ICD-10-CM

## 2024-02-01 NOTE — Patient Instructions (Signed)
 Visit Information  Thank you for taking time to visit with me today. Please don't hesitate to contact me if I can be of assistance to you before our next scheduled appointment.  Your next care management appointment is by telephone on 03-03-2024 at 10:00 am  Telephone follow-up in 1 month  Please call the care guide team at [phone] if you need to cancel, schedule, or reschedule an appointment.   Please call the Suicide and Crisis Lifeline: 988 call the [place]  National Suicide Prevention Lifeline: (931)391-9461 or TTY: (830)476-0535 TTY 281-252-0179) to talk to a trained counselor call 1-800-273-TALK (toll free, 24 hour hotline) if you are experiencing a Mental Health or Behavioral Health Crisis or need someone to talk to.  Rosina Forte, BSN RN [facility], [facility] RN Care Manager Direct Dial: [phone]  Fax: [phone]

## 2024-02-08 ENCOUNTER — Other Ambulatory Visit (HOSPITAL_COMMUNITY): Payer: Self-pay | Admitting: Family Medicine

## 2024-02-08 ENCOUNTER — Telehealth: Payer: Self-pay

## 2024-02-08 DIAGNOSIS — R928 Other abnormal and inconclusive findings on diagnostic imaging of breast: Secondary | ICD-10-CM

## 2024-02-08 NOTE — Progress Notes (Signed)
 Care Guide Pharmacy Note  02/08/2024 Name: Leslie Lindsey MRN: [id number] DOB: Jan 29, 1951  Referred By: Edman Meade PEDLAR, FNP Reason for referral: Complex Care Management (Outreach to schedule with Pharm d )   Leslie Lindsey is a 73 y.o. year old female who is a primary care patient of Bacchus, Meade PEDLAR, FNP.  Leslie Lindsey was referred to the pharmacist for assistance related to: HTN  Successful contact was made with the patient to discuss pharmacy services including being ready for the pharmacist to call at least 5 minutes before the scheduled appointment time and to have medication bottles and any blood pressure readings ready for review. The patient agreed to meet with the pharmacist via telephone visit on (date/time).02/10/2024  Jeoffrey Buffalo , RMA     [place]  [facility], [facility] Guide  Direct Dial: [phone]  Website: [email or web address]

## 2024-02-10 ENCOUNTER — Other Ambulatory Visit

## 2024-02-10 DIAGNOSIS — E1165 Type 2 diabetes mellitus with hyperglycemia: Secondary | ICD-10-CM

## 2024-02-10 NOTE — Progress Notes (Signed)
 02/16/2024 Name: Leslie Lindsey MRN: [id number] DOB: 01-04-1951  Chief Complaint  Patient presents with   Hypertension         Leslie Lindsey is a 73 y.o. year old female who presented for a telephone visit.   They were referred to the pharmacist by the [facility] Complex Case Management program for assistance in managing hypertension.    Subjective:  Care Team: Primary Care Provider: Edman Meade PEDLAR, FNP ; Next Scheduled Visit:  Future Appointments  Date Time Provider Department Center  02/17/2024 10:05 AM Vannie Reche RAMAN, NP [facility] [address]  03/03/2024 10:00 AM Bertrum Rosina HERO, RN [facility] None  04/11/2024  3:00 PM Edman Meade PEDLAR, FNP [facility] [address]  04/12/2024  3:15 PM Gregg Lek, MD [facility] None  01/08/2025  3:50 PM RPC-ANNUAL WELLNESS VISIT [facility] [address]    Medication Access/Adherence  Current Pharmacy:  [id number] - [place], [place] - [address] AT [facility] [address] [place] [address] Phone: [phone] Fax: [phone]  [facility] - [place], [place] - [address] [place] [address] Phone: [phone] Fax: [phone]   Patient reports affordability concerns with their medications: no concerns but wonders about jardiance  being stopped and if it was stopped because of insurance issues Patient reports access/transportation concerns to their pharmacy: No  Patient reports adherence concerns with their medications:  No  however does note some inconsistencies in how she takes her bp meds and metformin  compared to how EMR list describes use   Hypertension:  Current medications:  - Coreg  6.25mg  bid (AM/PM) - Hydralazine  25mg  TID (AM/Noon/PM not consistent with afternoon dose, concern over taking too much at once if she takes with the olmesartan -amlodipine -hydrochlorothiazide combo) - Olmesartan -amlodipine -hydrochlorothiazide 40-10-25mg  daily   Patient  denies hypotensive s/sx including dizziness, lightheadedness.  Patient denies hypertensive symptoms including headache, chest pain, shortness of breath  Objective:  Lab Results  Component Value Date   HGBA1C 6.4 (H) 01/17/2024    Lab Results  Component Value Date   CREATININE 1.08 (H) 01/17/2024   BUN 28 (H) 01/17/2024   NA 139 01/17/2024   K 4.8 01/17/2024   CL 102 01/17/2024   CO2 21 01/17/2024    Lab Results  Component Value Date   CHOL 197 01/17/2024   HDL 77 01/17/2024   LDLCALC 102 (H) 01/17/2024   TRIG 103 01/17/2024   CHOLHDL 2.6 01/17/2024    Medications Reviewed Today     Reviewed by Pandora Cadet, [facility] (Pharmacist) on 02/10/24 at 1636  Med List Status: <None>   Medication Order Taking? Sig Documenting Provider Last Dose Status Informant  acetaminophen  (TYLENOL ) 325 MG tablet [id number]  Take 2 tablets (650 mg total) by mouth every 6 (six) hours as needed for mild pain (pain score 1-3) or headache (or Fever >/= 101). Pearlean Manus, MD  Active   albuterol  (VENTOLIN  HFA) 108 (90 Base) MCG/ACT inhaler [id number]  Inhale 2 puffs into the lungs every 4 (four) hours as needed for wheezing or shortness of breath. Edman Meade PEDLAR, FNP  Active   aspirin  EC 81 MG tablet [id number]  Take 1 tablet (81 mg total) by mouth daily with breakfast. -take Aspirin  81 mg daily along with Plavix  75 mg daily for 90 days then after that STOP the Plavix   and continue ONLY Aspirin  81 mg daily indefinitely-- Bacchus, Gloria Z, FNP  Active   atorvastatin  (LIPITOR) 40 MG tablet [id number]  Take 1 tablet (40 mg total) by mouth daily. Edman Meade PEDLAR, FNP  Active   carvedilol  (COREG ) 6.25 MG tablet [id number]  Take 1 tablet (6.25 mg total) by mouth 2 (two) times daily. Edman Meade PEDLAR, FNP  Active   cloNIDine  (CATAPRES ) 0.2 MG tablet [id number]  Take 1 tablet (0.2 mg total) by mouth 2 (two) times daily as needed. Edman Meade PEDLAR, FNP  Active   diphenhydrAMINE (SOMINEX) 25 MG tablet [id number]  Take  25 mg by mouth at bedtime as needed for allergies or itching. For allergies [provider]  Active Self, Pharmacy Records  empagliflozin  (JARDIANCE ) 10 MG TABS tablet [id number]  Take 1 tablet (10 mg total) by mouth daily before breakfast.  Patient not taking: Reported on 02/01/2024   Bacchus, Gloria Z, FNP  Active   hydrALAZINE  (APRESOLINE ) 25 MG tablet [id number]  Take 1 tablet (25 mg total) by mouth 3 (three) times daily. Edman Meade PEDLAR, FNP  Active   levothyroxine  (SYNTHROID ) 125 MCG tablet [id number]  Take 1 tablet (125 mcg total) by mouth daily.  Patient not taking: Reported on 02/01/2024   Bacchus, Gloria Z, FNP  Active   levothyroxine  (SYNTHROID ) 150 MCG tablet [id number]  Take 1 tablet (150 mcg total) by mouth daily. Edman Meade PEDLAR, FNP  Active   metFORMIN  (GLUCOPHAGE ) 1000 MG tablet [id number]  Take 1 tablet (1,000 mg total) by mouth 2 (two) times daily with a meal.  Patient taking differently: Take 1,000 mg by mouth daily.   Bacchus, Gloria Z, FNP  Active   Olmesartan -amLODIPine -HCTZ 40-10-25 MG TABS [id number]  Take 1 tablet by mouth daily. Edman Meade PEDLAR, FNP  Active   potassium chloride  (KLOR-CON  M) 10 MEQ tablet [id number]  Take 1 tablet (10 mEq total) by mouth daily. Edman Meade PEDLAR, FNP  Active   Semaglutide  (RYBELSUS ) 14 MG TABS [id number]  Take 1 tablet (14 mg total) by mouth daily. Edman Meade PEDLAR, FNP  Active               Assessment/Plan:   Hypertension: - Currently controlled - Reviewed long term cardiovascular and renal outcomes of uncontrolled blood pressure - Reviewed appropriate blood pressure monitoring technique and reviewed goal blood pressure. Recommended to check home blood pressure and heart rate twice daily  - Recommend to take maintenance BP meds consistently, reserve clonidine  for true PRN use versus scheduled.  - counseled in detail regarding proper use of medications. Will message pcp regarding jardiance  renewal. Follow Up Plan: PRN  per patient/team request.   Lang Sieve, PharmD, BCGP Clinical Pharmacist  [phone]

## 2024-02-16 ENCOUNTER — Other Ambulatory Visit: Payer: Self-pay | Admitting: Family Medicine

## 2024-02-16 ENCOUNTER — Other Ambulatory Visit: Payer: Self-pay

## 2024-02-16 DIAGNOSIS — E1165 Type 2 diabetes mellitus with hyperglycemia: Secondary | ICD-10-CM

## 2024-02-16 MED ORDER — EMPAGLIFLOZIN 10 MG PO TABS: 10.0000 mg | ORAL_TABLET | Freq: Every day | ORAL | 1 refills | Status: AC

## 2024-02-16 MED ORDER — EMPAGLIFLOZIN 10 MG PO TABS: 10.0000 mg | ORAL_TABLET | Freq: Every day | ORAL | 1 refills | Status: DC

## 2024-02-17 ENCOUNTER — Encounter (HOSPITAL_BASED_OUTPATIENT_CLINIC_OR_DEPARTMENT_OTHER): Payer: Self-pay | Admitting: Family

## 2024-02-17 ENCOUNTER — Ambulatory Visit (INDEPENDENT_AMBULATORY_CARE_PROVIDER_SITE_OTHER): Admitting: Family

## 2024-02-17 VITALS — BP 150/71 | HR 89 | Ht 66.0 in | Wt 196.3 lb

## 2024-02-17 DIAGNOSIS — I10 Essential (primary) hypertension: Secondary | ICD-10-CM | POA: Diagnosis not present

## 2024-02-17 DIAGNOSIS — Z8673 Personal history of transient ischemic attack (TIA), and cerebral infarction without residual deficits: Secondary | ICD-10-CM

## 2024-02-17 DIAGNOSIS — E785 Hyperlipidemia, unspecified: Secondary | ICD-10-CM | POA: Diagnosis not present

## 2024-02-17 MED ORDER — ROSUVASTATIN CALCIUM 40 MG PO TABS: 40.0000 mg | ORAL_TABLET | Freq: Every day | ORAL | 1 refills | Status: AC

## 2024-02-17 MED ORDER — CARVEDILOL 12.5 MG PO TABS: 12.5000 mg | ORAL_TABLET | Freq: Two times a day (BID) | ORAL | 1 refills | Status: AC

## 2024-02-17 NOTE — Progress Notes (Signed)
 [facility] Initial Assessment:    Date:  02/17/2024   ID:  Leslie Lindsey, DOB 07-04-1950, MRN [id number]  PCP:  Edman Meade PEDLAR, FNP  Cardiologist:  None  Nephrologist:  Referring MD: Edman Meade PEDLAR, FNP   CC: Hypertension  History of Present Illness:    Leslie Lindsey is a 73 y.o. 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 CTA head and neck with asymptomatic carotid stenosis on the left, moderate to severe intracranial stenosis of multiple arteries. Seen by Dr. Sheree of [facility] in outpatient setting recommended for annual monitoring, next due 09/2024.   08/2023 normal plasma metanephrines, renin-aldo ratio with no hyperaldosteronism.   Leslie Lindsey is a very pleasant lady who was diagnosed with hypertension in her 89s. Resides at home with her son. Strong family history of hypertension. It has been difficult to control. Blood pressure checked with arm cuff at home. Readings have been all over the place with early morning readings pretty good then escalates throughout the day. Her SBP range at home is 120-200s. she reports tobacco use never. Alcohol use never. For exercise she she likes to walk but has not been doing regularly since her CVA as she does have some instability. Ambulates with a cane. she eats at home and outside of the home and does not follow low sodium diet but has been trying to reduce her sodium intake. Notes often eating convenience foods since her CVA. She drinks predominantly during the day. Limited caffeine intake with green tea on occasion. Takes Clonidine  PRN periodically 4-5 times per week. 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)   Diabetes mellitus without complication (HCC)    Hyperlipidemia    Hypertension    Hyperthyroidism    PONV (postoperative nausea and vomiting)    Shortness of breath    Vitamin D  deficiency     Past Surgical History:  Procedure Laterality Date   EYE SURGERY     30 years ago   HYSTEROSCOPY WITH D & C  11/11/2010   Procedure: DILATATION AND CURETTAGE (D&C) /HYSTEROSCOPY;  Surgeon: Norleen LULLA Server, MD;  Location: [facility];  Service: Gynecology;  Laterality: N/A;  WITH REMOVAL ENDOMETRIAL POLYP    Current Medications: Active Medications[1]   Allergies:   Peanut-containing drug products   Social History   Socioeconomic History   Marital status: Divorced    Spouse name: Not on file   Number of children: Not on file   Years of education: Not on file   Highest education level: Not on file  Occupational History   Not on file  Tobacco Use   Smoking status: Never   Smokeless tobacco: Never  Vaping Use   Vaping status: Never Used  Substance and Sexual Activity   Alcohol use: No   Drug use: No   Sexual activity: Yes    Birth control/protection: Post-menopausal  Other Topics Concern   Not on file  Social History Narrative   Not on file   Social Drivers of Health   Tobacco  Use: Low Risk (02/17/2024)   Patient History    Smoking Tobacco Use: Never    Smokeless Tobacco Use: Never    Passive Exposure: Not on file  Financial Resource Strain: Low Risk (02/17/2024)   Overall Financial Resource Strain (CARDIA)    Difficulty of Paying Living Expenses: Not very hard  Food Insecurity: No Food Insecurity (01/06/2024)   Epic    Worried About Programme Researcher, Broadcasting/film/video in the Last Year: Never true    Ran Out of Food in the Last Year: Never true  Transportation Needs:  No Transportation Needs (01/06/2024)   Epic    Lack of Transportation (Medical): No    Lack of Transportation (Non-Medical): No  Physical Activity: Inactive (01/06/2024)   Exercise Vital Sign    Days of Exercise per Week: 0 days    Minutes of Exercise per Session: 0 min  Stress: No Stress Concern Present (01/06/2024)   [employer] of Occupational Health - Occupational Stress Questionnaire    Feeling of Stress: Not at all  Social Connections: Moderately Integrated (01/06/2024)   Social Connection and Isolation Panel    Frequency of Communication with Friends and Family: More than three times a week    Frequency of Social Gatherings with Friends and Family: More than three times a week    Attends Religious Services: More than 4 times per year    Active Member of Clubs or Organizations: Yes    Attends Banker Meetings: More than 4 times per year    Marital Status: Divorced  Depression (PHQ2-9): Low Risk (02/01/2024)   Depression (PHQ2-9)    PHQ-2 Score: 1  Recent Concern: Depression (PHQ2-9) - Medium Risk (12/02/2023)   Depression (PHQ2-9)    PHQ-2 Score: 8  Alcohol Screen: Not on file  Housing: Low Risk (01/06/2024)   Epic    Unable to Pay for Housing in the Last Year: No    Number of Times Moved in the Last Year: 0    Homeless in the Last Year: No  Utilities: Not At Risk (01/06/2024)   Epic    Threatened with loss of utilities: No  Health Literacy: Adequate Health Literacy (01/06/2024)   B1300 Health Literacy    Frequency of need for help with medical instructions: Never     Family History: The patient's family history is negative for Anesthesia problems, Hypotension, Malignant hyperthermia, and Pseudochol deficiency.  ROS:   Please see the history of present illness.     All other systems reviewed and are negative.  EKGs/Labs/Other Studies Reviewed:         Recent Labs: 01/17/2024: ALT 14; BUN 28; Creatinine, Ser 1.08; Hemoglobin 12.5; Platelets 340;  Potassium 4.8; Sodium 139; TSH 1.280   Recent Lipid Panel    Component Value Date/Time   CHOL 197 01/17/2024 0913   TRIG 103 01/17/2024 0913   HDL 77 01/17/2024 0913   CHOLHDL 2.6 01/17/2024 0913   CHOLHDL 2.5 07/02/2023 0739   VLDL 20 07/02/2023 0739   LDLCALC 102 (H) 01/17/2024 0913    Physical Exam:   VS:  BP (!) 150/71 (BP Location: Right Arm)   Pulse 89   Ht 5' 6 (1.676 m)   Wt 196 lb 4.8 oz (89 kg)   SpO2 99%   BMI 31.68 kg/m  , BMI Body mass index is 31.68 kg/m. GENERAL:  Well appearing, overweight.  HEENT: Pupils equal round and reactive, fundi not visualized, oral mucosa unremarkable NECK:  No jugular venous distention, waveform within  normal limits, carotid upstroke brisk and symmetric, no bruits, no thyromegaly LYMPHATICS:  No cervical adenopathy LUNGS:  Clear to auscultation bilaterally HEART:  RRR.  PMI not displaced or sustained,S1 and S2 within normal limits, no S3, no S4, no clicks, no rubs, no murmurs ABD:  Flat, positive bowel sounds normal in frequency in pitch, no bruits, no rebound, no guarding, no midline pulsatile mass, no hepatomegaly, no splenomegaly EXT:  2 plus pulses throughout, no edema, no cyanosis no clubbing SKIN:  No rashes no nodules NEURO:  Cranial nerves II through XII grossly intact, motor grossly intact throughout PSYCH:  Cognitively intact, oriented to person place and time   ASSESSMENT/PLAN:    HTN - BP not at goal <130/80.  Plan for renal artery duplex. Increase coreg  to 12.5mg  BID. Continue hydralazine  25mg  TID (could increase in future), olmesartan -amlodipine -hydrochlorothiazide 40-10-25mg  daily. Encouraged use of PRN Clonidine  sparingly due to potential for rebound hypertension. Felt poorly on daily dosing of Clonidine .  If secondary workup unremarkable consider renal denervation Recommend aiming for 150 minutes of moderate intensity activity per week and following a heart healthy diet.   Discussed to monitor BP at home at least  2 hours after medications and sitting for 5-10 minutes.   Daytime somnolence - notes sleeping poorly. Will trial OTC Melatonin 5mg  at bedtime. If daytime somnolence not improved at follow up, consider sleep study. Reports no snoring. STOPBang 4.    DM2 - Continue to follow with PCP.   Hx of CVA - discussed importance of BP, lipid, DM2 control.  HLD, LDL goal <55 - Given hx of CVA with co morbidities of HTN and age >81yo ideally LDL <55. Only taking atorvastatin  every other day due to perceived side effects. 01/17/24 LDL 102. Stop atorvastatin . Start Rosuvastatin  40mg  daily. Coordinate repeat lipid panel at follow up  Screening for Secondary Hypertension:     02/17/2024   11:08 AM  Causes  Renovascular HTN Screened  Thyroid Disease Screened     - Comments hypothyroidism managed by PCP  Hyperaldosteronism Screened     - Comments 08/2023 negative renin-aldo ratio  Pheochromocytoma Screened     - Comments 08/2023 normal plasma metanephrines  Compliance Screened    Relevant Labs/Studies:    Latest Ref Rng & Units 01/17/2024    9:13 AM 09/20/2023    4:01 PM 07/02/2023    7:39 AM  Basic Labs  Sodium 134 - 144 mmol/L 139  140  138   Potassium 3.5 - 5.2 mmol/L 4.8  4.5  3.6   Creatinine 0.57 - 1.00 mg/dL 8.91  8.94  9.23        Latest Ref Rng & Units 01/17/2024    9:13 AM 11/02/2023    9:19 AM  Thyroid   TSH 0.450 - 4.500 uIU/mL 1.280  10.200        Latest Ref Rng & Units 09/20/2023    4:01 PM  Renin/Aldosterone   Aldosterone 0.0 - 30.0 ng/dL 1.2   Aldos/Renin Ratio 0.0 - 30.0 0.5        Latest Ref Rng & Units 09/20/2023    4:01 PM  Metanephrines/Catecholamines   Metanephrines 0.0 - 88.0 pg/mL <25.0   Normetanephrines  0.0 - 285.2 pg/mL 56.1           02/17/2024   10:55 AM  Renovascular   Renal Artery US  Completed Yes     Disposition:    FU with MD/APP/PharmD in 2-3 months    Medication Adjustments/Labs and Tests Ordered:  Current medicines are reviewed at length  with the patient today.  Concerns regarding medicines are outlined above.  Orders Placed This Encounter  Procedures   VAS US  RENAL ARTERY DUPLEX   Meds ordered this encounter  Medications   rosuvastatin  (CRESTOR ) 40 MG tablet    Sig: Take 1 tablet (40 mg total) by mouth daily.    Dispense:  90 tablet    Refill:  1    Stop Atorvastatin     Supervising Provider:   CHRISTOPHER, BRIDGETTE [[id number]]   carvedilol  (COREG ) 12.5 MG tablet    Sig: Take 1 tablet (12.5 mg total) by mouth 2 (two) times daily.    Dispense:  180 tablet    Refill:  1    Supervising Provider:   LONNI SLAIN [[id number]]   Signed, Reche GORMAN Finder, NP  02/17/2024 11:08 AM    [facility]     [1]  Current Meds  Medication Sig   acetaminophen  (TYLENOL ) 325 MG tablet Take 2 tablets (650 mg total) by mouth every 6 (six) hours as needed for mild pain (pain score 1-3) or headache (or Fever >/= 101).   albuterol  (VENTOLIN  HFA) 108 (90 Base) MCG/ACT inhaler Inhale 2 puffs into the lungs every 4 (four) hours as needed for wheezing or shortness of breath.   aspirin  EC 81 MG tablet Take 1 tablet (81 mg total) by mouth daily with breakfast. -take Aspirin  81 mg daily along with Plavix  75 mg daily for 90 days then after that STOP the Plavix   and continue ONLY Aspirin  81 mg daily indefinitely--   cloNIDine  (CATAPRES ) 0.2 MG tablet Take 1 tablet (0.2 mg total) by mouth 2 (two) times daily as needed.   empagliflozin  (JARDIANCE ) 10 MG TABS tablet Take 1 tablet (10 mg total) by mouth daily before breakfast.   hydrALAZINE  (APRESOLINE ) 25 MG tablet Take 1 tablet (25 mg total) by mouth 3 (three) times daily.   levothyroxine  (SYNTHROID ) 150 MCG tablet Take 1 tablet (150 mcg total) by mouth daily.   metFORMIN  (GLUCOPHAGE ) 1000 MG tablet Take 1 tablet (1,000 mg total) by mouth 2 (two) times daily with a meal. (Patient taking differently: Take 500 mg by mouth daily with breakfast.)   Olmesartan -amLODIPine -HCTZ  40-10-25 MG TABS Take 1 tablet by mouth daily.   potassium chloride  (KLOR-CON  M) 10 MEQ tablet Take 1 tablet (10 mEq total) by mouth daily.   rosuvastatin  (CRESTOR ) 40 MG tablet Take 1 tablet (40 mg total) by mouth daily.   Semaglutide  (RYBELSUS ) 14 MG TABS Take 1 tablet (14 mg total) by mouth daily.   [DISCONTINUED] atorvastatin  (LIPITOR) 40 MG tablet Take 1 tablet (40 mg total) by mouth daily.   [DISCONTINUED] carvedilol  (COREG ) 6.25 MG tablet Take 1 tablet (6.25 mg total) by mouth 2 (two) times daily.

## 2024-02-17 NOTE — Patient Instructions (Addendum)
 Medication Instructions:   STOP Atorvastatin   START Rosuvastatin  (Crestor ) 40mg  every evening for cholesterol  START Melatonin 5mg  every evening to help with sleep  CHANGE Carvedilol  (Coreg ) to 12.5mg  twice per day    Testing/Procedures: Your physician has requested that you have a renal artery duplex. During this test, an ultrasound is used to evaluate blood flow to the kidneys. Allow one hour for this exam. Do not eat after midnight the day before and avoid carbonated beverages. Take your medications as you usually do.    Follow-Up: Please follow up in 2-3 months in ADV HTN CLINIC with Dr. Raford, Reche Finder, NP or Allean Mink PharmD    Special Instructions:    Bring pill bottles to your next appointment.   [facility] Stroke Support Group: Contact: Ms. Connell Kiss, DPT [phone] [email or web address] Meeting on the 2nd Thursday of each month 4pm-5pm at [facility] [place] [phone])  Cardiac medication schedule:  AM Carvedilol  Hydralazine   LUNCH Hydralazine  Olmesartan -Amlodipine -hydrochlorothiazide   PM Carvedilol  Hydralazine  Rosuvastatin 

## 2024-02-18 LAB — OPHTHALMOLOGY REPORT-SCANNED

## 2024-03-03 ENCOUNTER — Other Ambulatory Visit: Payer: Self-pay | Admitting: *Deleted

## 2024-03-03 NOTE — Patient Outreach (Signed)
 Complex Care Management   Visit Note  03/03/2024  Name:  Leslie Lindsey MRN: [id number] DOB: 1950-05-14  Situation: Referral received for Complex Care Management related to HTN I obtained verbal consent from Patient.  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)   Diabetes mellitus without complication (HCC)    Hyperlipidemia    Hypertension    Hyperthyroidism    PONV (postoperative nausea and vomiting)    Shortness of breath    Vitamin D  deficiency     Assessment: Patient Reported Symptoms:  Cognitive Cognitive Status: Able to follow simple commands, Alert and oriented to person, place, and time Cognitive/Intellectual Conditions Management [RPT]: None reported or documented in medical history or problem list   Health Maintenance Behaviors: Annual physical exam Healing Pattern: Average Health Facilitated by: Rest  Neurological Neurological Review of Symptoms: Weakness Neurological Management Strategies: Coping strategies Neurological Self-Management Outcome: 3 (uncertain)  HEENT HEENT Symptoms Reported: Tearing HEENT Management Strategies: Routine screening HEENT Self-Management Outcome: 4 (good)    Cardiovascular Cardiovascular Symptoms Reported: Dizziness Does patient have uncontrolled Hypertension?: Yes Is patient checking Blood Pressure at home?: No Cardiovascular Management Strategies: Coping strategies Cardiovascular Self-Management Outcome: 3 (uncertain)  Respiratory Respiratory Symptoms Reported: Dry cough Respiratory Management Strategies: Coping strategies, Routine screening Respiratory Self-Management Outcome: 4 (good)  Endocrine Endocrine Symptoms Reported: No symptoms reported Is patient diabetic?: Yes Is patient checking blood sugars at home?: No Endocrine Self-Management  Outcome: 4 (good)  Gastrointestinal Gastrointestinal Symptoms Reported: No symptoms reported Gastrointestinal Self-Management Outcome: 4 (good)    Genitourinary Genitourinary Symptoms Reported: No symptoms reported Genitourinary Self-Management Outcome: 4 (good)  Integumentary Integumentary Symptoms Reported: No symptoms reported Skin Management Strategies: Routine screening Skin Self-Management Outcome: 5 (very good)  Musculoskeletal Musculoskelatal Symptoms Reviewed: Weakness Musculoskeletal Management Strategies: Coping strategies Musculoskeletal Self-Management Outcome: 3 (uncertain) Falls in the past year?: Yes Number of falls in past year: 1 or less Was there an injury with Fall?: No Fall Risk Category Calculator: 1 Patient Fall Risk Level: Low Fall Risk Patient at Risk for Falls Due to: History of fall(s) Fall risk Follow up: Falls evaluation completed, Education provided  Psychosocial Psychosocial Symptoms Reported: No symptoms reported Behavioral Management Strategies: Coping strategies Behavioral Health Self-Management Outcome: 5 (very good) Major Change/Loss/Stressor/Fears (CP): Denies Techniques to Cope with Loss/Stress/Change: Diversional activities      03/03/2024    PHQ2-9 Depression Screening   Little interest or pleasure in doing things Not at all  Feeling down, depressed, or hopeless Not at all  PHQ-2 - Total Score 0  Trouble falling or staying asleep, or sleeping too much    Feeling tired or having little energy    Poor appetite or overeating     Feeling bad about yourself - or that you are a failure or have let yourself or your family down    Trouble concentrating on things, such as reading the newspaper or watching television    Moving or speaking so slowly that other people could have noticed.  Or the opposite - being so fidgety or restless that you have been moving around a lot more than usual    Thoughts that you would be better off dead, or hurting  yourself in some way    PHQ2-9 Total Score    If you checked off any problems,  how difficult have these problems made it for you to do your work, take care of things at home, or get along with other people    Depression Interventions/Treatment      Today's Vitals   Pain Scale: 0-10 Pain Score: 0-No pain  Medications Reviewed Today     Reviewed by Bertrum Rosina HERO, RN (Registered Nurse) on 03/03/24 at 1011  Med List Status: <None>   Medication Order Taking? Sig Documenting Provider Last Dose Status Informant  acetaminophen  (TYLENOL ) 325 MG tablet [id number] Yes Take 2 tablets (650 mg total) by mouth every 6 (six) hours as needed for mild pain (pain score 1-3) or headache (or Fever >/= 101). Pearlean Manus, MD  Active   albuterol  (VENTOLIN  HFA) 108 (90 Base) MCG/ACT inhaler [id number] Yes Inhale 2 puffs into the lungs every 4 (four) hours as needed for wheezing or shortness of breath. Edman Meade PEDLAR, FNP  Active   aspirin  EC 81 MG tablet [id number] Yes Take 1 tablet (81 mg total) by mouth daily with breakfast. -take Aspirin  81 mg daily along with Plavix  75 mg daily for 90 days then after that STOP the Plavix   and continue ONLY Aspirin  81 mg daily indefinitely-- Bacchus, Gloria Z, FNP  Active   carvedilol  (COREG ) 12.5 MG tablet [id number] Yes Take 1 tablet (12.5 mg total) by mouth 2 (two) times daily. Vannie Reche RAMAN, NP  Active   cloNIDine  (CATAPRES ) 0.2 MG tablet [id number] Yes Take 1 tablet (0.2 mg total) by mouth 2 (two) times daily as needed. Edman Meade PEDLAR, FNP  Active   empagliflozin  (JARDIANCE ) 10 MG TABS tablet [id number] Yes Take 1 tablet (10 mg total) by mouth daily before breakfast. Bacchus, Meade PEDLAR, FNP  Active   hydrALAZINE  (APRESOLINE ) 25 MG tablet [id number] Yes Take 1 tablet (25 mg total) by mouth 3 (three) times daily. Bacchus, Gloria Z, FNP  Active   levothyroxine  (SYNTHROID ) 150 MCG tablet [id number] Yes Take 1 tablet (150 mcg total) by mouth daily. Edman Meade PEDLAR, FNP   Active   metFORMIN  (GLUCOPHAGE ) 1000 MG tablet [id number] Yes Take 1 tablet (1,000 mg total) by mouth 2 (two) times daily with a meal.  Patient taking differently: Take 500 mg by mouth daily with breakfast.   Bacchus, Gloria Z, FNP  Active   Olmesartan -amLODIPine -HCTZ 40-10-25 MG TABS [id number] Yes Take 1 tablet by mouth daily. Edman Meade PEDLAR, FNP  Active   potassium chloride  (KLOR-CON  M) 10 MEQ tablet [id number] Yes Take 1 tablet (10 mEq total) by mouth daily. Edman Meade PEDLAR, FNP  Active   rosuvastatin  (CRESTOR ) 40 MG tablet [id number] Yes Take 1 tablet (40 mg total) by mouth daily. Vannie Reche RAMAN, NP  Active   Semaglutide  (RYBELSUS ) 14 MG TABS [id number] Yes Take 1 tablet (14 mg total) by mouth daily. Bacchus, Gloria Z, FNP  Active             Recommendation:   Home Health requests: Physical therapy Continue Current Plan of Care  Follow Up Plan:   Telephone follow-up in 1 month  Rosina Bertrum, BSN RN [facility], [facility] RN Care Manager Direct Dial: [phone]  Fax: [phone]

## 2024-03-03 NOTE — Patient Instructions (Signed)
 Visit Information  Thank you for taking time to visit with me today. Please don't hesitate to contact me if I can be of assistance to you before our next scheduled appointment.  Your next care management appointment is by telephone on 04-03-2024 at 10:00 am  Telephone follow-up in 1 month  Please call the care guide team at [phone] if you need to cancel, schedule, or reschedule an appointment.   Please call the Suicide and Crisis Lifeline: 988 call the [place]  National Suicide Prevention Lifeline: (209)271-6955 or TTY: 706-730-2868 TTY 8175358015) to talk to a trained counselor call 1-800-273-TALK (toll free, 24 hour hotline) if you are experiencing a Mental Health or Behavioral Health Crisis or need someone to talk to.  Rosina Forte, BSN RN [facility], [facility] RN Care Manager Direct Dial: [phone]  Fax: [phone]

## 2024-03-10 ENCOUNTER — Other Ambulatory Visit: Payer: Self-pay | Admitting: Family Medicine

## 2024-03-10 DIAGNOSIS — I1A0 Resistant hypertension: Secondary | ICD-10-CM

## 2024-03-27 ENCOUNTER — Encounter (HOSPITAL_BASED_OUTPATIENT_CLINIC_OR_DEPARTMENT_OTHER)

## 2024-03-28 ENCOUNTER — Ambulatory Visit (HOSPITAL_COMMUNITY)

## 2024-03-28 ENCOUNTER — Encounter (HOSPITAL_COMMUNITY)

## 2024-03-30 ENCOUNTER — Other Ambulatory Visit: Payer: Self-pay

## 2024-03-30 ENCOUNTER — Telehealth: Payer: Self-pay | Admitting: Family Medicine

## 2024-03-30 DIAGNOSIS — E038 Other specified hypothyroidism: Secondary | ICD-10-CM

## 2024-03-30 MED ORDER — LEVOTHYROXINE SODIUM 150 MCG PO TABS: 150.0000 ug | ORAL_TABLET | Freq: Every day | ORAL | 0 refills | Status: AC

## 2024-03-30 NOTE — Telephone Encounter (Signed)
 Sent to pharmacy

## 2024-03-30 NOTE — Telephone Encounter (Signed)
 Prescription Request  03/30/2024  LOV: 12/02/2023  What is the name of the medication or equipment? levothyroxine  (SYNTHROID ) 150 MCG tablet [[id number]]   Have you contacted your pharmacy to request a refill? Yes   Which pharmacy would you like this sent to?  [facility]   Patient notified that their request is being sent to the clinical staff for review and that they should receive a response within 2 business days.   Please advise at Mobile [phone] (mobile)

## 2024-04-03 ENCOUNTER — Other Ambulatory Visit: Payer: Self-pay | Admitting: *Deleted

## 2024-04-03 NOTE — Patient Outreach (Signed)
 Complex Care Management   Visit Note  04/03/2024  Name:  Leslie Lindsey MRN: [id number] DOB: Apr 16, 1950  Situation: Referral received for Complex Care Management related to HTN I obtained verbal consent from Patient.  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)   Diabetes mellitus without complication (HCC)    Hyperlipidemia    Hypertension    Hyperthyroidism    PONV (postoperative nausea and vomiting)    Shortness of breath    Vitamin D  deficiency     Assessment: Patient Reported Symptoms:  Cognitive Cognitive Status: No symptoms reported Cognitive/Intellectual Conditions Management [RPT]: None reported or documented in medical history or problem list   Health Maintenance Behaviors: Annual physical exam Healing Pattern: Average Health Facilitated by: Rest  Neurological Neurological Review of Symptoms: Weakness Neurological Management Strategies: Routine screening Neurological Self-Management Outcome: 3 (uncertain)  HEENT HEENT Symptoms Reported: No symptoms reported HEENT Management Strategies: Routine screening HEENT Self-Management Outcome: 4 (good)    Cardiovascular Cardiovascular Symptoms Reported: No symptoms reported Does patient have uncontrolled Hypertension?: Yes Is patient checking Blood Pressure at home?: Yes Patient's Recent BP reading at home: 127/72 Cardiovascular Management Strategies: Routine screening Cardiovascular Self-Management Outcome: 4 (good) Cardiovascular Comment: report dizziness when BP is in the low 100s  Respiratory Respiratory Symptoms Reported: No symptoms reported Respiratory Management Strategies: Routine screening Respiratory Self-Management Outcome: 4 (good)  Endocrine Endocrine Symptoms Reported: No symptoms reported Is patient diabetic?: Yes Is  patient checking blood sugars at home?: No Endocrine Self-Management Outcome: 4 (good)  Gastrointestinal Gastrointestinal Symptoms Reported: No symptoms reported Gastrointestinal Management Strategies: Medication therapy Gastrointestinal Self-Management Outcome: 4 (good)    Genitourinary Genitourinary Symptoms Reported: No symptoms reported    Integumentary Integumentary Symptoms Reported: No symptoms reported Skin Management Strategies: Routine screening Skin Self-Management Outcome: 5 (very good)  Musculoskeletal Musculoskelatal Symptoms Reviewed: Weakness Musculoskeletal Management Strategies: Coping strategies Musculoskeletal Self-Management Outcome: 3 (uncertain) Falls in the past year?: Yes Number of falls in past year: 2 or more Was there an injury with Fall?: No Fall Risk Category Calculator: 2 Patient Fall Risk Level: Moderate Fall Risk Patient at Risk for Falls Due to: History of fall(s) Fall risk Follow up: Falls evaluation completed, Education provided  Psychosocial Psychosocial Symptoms Reported: No symptoms reported Behavioral Management Strategies: Coping strategies Behavioral Health Self-Management Outcome: 5 (very good) Major Change/Loss/Stressor/Fears (CP): Denies Techniques to Cope with Loss/Stress/Change: Diversional activities Quality of Family Relationships: supportive Do you feel physically threatened by others?: No    04/03/2024    PHQ2-9 Depression Screening   Little interest or pleasure in doing things Not at all  Feeling down, depressed, or hopeless Several days  PHQ-2 - Total Score 1  Trouble falling or staying asleep, or sleeping too much    Feeling tired or having little energy    Poor appetite or overeating     Feeling bad about yourself - or that you are a failure or have let yourself or your family down    Trouble concentrating on things, such as reading the newspaper or watching television    Moving or speaking so slowly that other people could  have noticed.  Or the opposite - being so fidgety or restless that you have been moving around a lot more than usual    Thoughts that you would be better  off dead, or hurting yourself in some way    PHQ2-9 Total Score    If you checked off any problems, how difficult have these problems made it for you to do your work, take care of things at home, or get along with other people    Depression Interventions/Treatment      Today's Vitals   04/03/24 1006  BP: 127/72   Pain Scale: 0-10 Pain Score: 0-No pain  Medications Reviewed Today     Reviewed by Bertrum Rosina HERO, RN (Registered Nurse) on 04/03/24 at 1002  Med List Status: <None>   Medication Order Taking? Sig Documenting Provider Last Dose Status Informant  acetaminophen  (TYLENOL ) 325 MG tablet [id number]  Take 2 tablets (650 mg total) by mouth every 6 (six) hours as needed for mild pain (pain score 1-3) or headache (or Fever >/= 101). Pearlean Manus, MD  Active   albuterol  (VENTOLIN  HFA) 108 (90 Base) MCG/ACT inhaler [id number]  Inhale 2 puffs into the lungs every 4 (four) hours as needed for wheezing or shortness of breath. Edman Meade PEDLAR, FNP  Active   aspirin  EC 81 MG tablet [id number]  Take 1 tablet (81 mg total) by mouth daily with breakfast. -take Aspirin  81 mg daily along with Plavix  75 mg daily for 90 days then after that STOP the Plavix   and continue ONLY Aspirin  81 mg daily indefinitely-- Bacchus, Gloria Z, FNP  Active   carvedilol  (COREG ) 12.5 MG tablet [id number]  Take 1 tablet (12.5 mg total) by mouth 2 (two) times daily. Vannie Reche RAMAN, NP  Active   cloNIDine  (CATAPRES ) 0.2 MG tablet [id number]  TAKE 1 TABLET BY MOUTH TWICE  DAILY AS NEEDED Bacchus, Gloria Z, FNP  Active   empagliflozin  (JARDIANCE ) 10 MG TABS tablet [id number]  Take 1 tablet (10 mg total) by mouth daily before breakfast. Bacchus, Meade PEDLAR, FNP  Active   hydrALAZINE  (APRESOLINE ) 25 MG tablet [id number]  Take 1 tablet (25 mg total) by mouth 3 (three) times  daily. Bacchus, Gloria Z, FNP  Active   levothyroxine  (SYNTHROID ) 150 MCG tablet [id number] Yes Take 1 tablet (150 mcg total) by mouth daily. Edman Meade PEDLAR, FNP  Active   metFORMIN  (GLUCOPHAGE ) 1000 MG tablet [id number] Yes Take 1 tablet (1,000 mg total) by mouth 2 (two) times daily with a meal.  Patient taking differently: Take 500 mg by mouth daily with breakfast.   Bacchus, Gloria Z, FNP  Active   Olmesartan -amLODIPine -HCTZ 40-10-25 MG TABS [id number]  Take 1 tablet by mouth daily. Edman Meade PEDLAR, FNP  Active   potassium chloride  (KLOR-CON  M) 10 MEQ tablet [id number]  Take 1 tablet (10 mEq total) by mouth daily. Edman Meade PEDLAR, FNP  Active   rosuvastatin  (CRESTOR ) 40 MG tablet [id number]  Take 1 tablet (40 mg total) by mouth daily. Vannie Reche RAMAN, NP  Active   Semaglutide  (RYBELSUS ) 14 MG TABS [id number] Yes Take 1 tablet (14 mg total) by mouth daily. Bacchus, Gloria Z, FNP  Active             Recommendation:   Continue Current Plan of Care  Follow Up Plan:   Telephone follow-up in 1 month  Rosina Bertrum, BSN RN [facility], [facility] RN Care Manager Direct Dial: [phone]  Fax: [phone]

## 2024-04-03 NOTE — Patient Instructions (Signed)
 Visit Information  Thank you for taking time to visit with me today. Please don't hesitate to contact me if I can be of assistance to you before our next scheduled appointment.  Your next care management appointment is by telephone on 05-01-2024 at 10:00 am  Telephone follow-up in 1 month  Please call the care guide team at [phone] if you need to cancel, schedule, or reschedule an appointment.   Please call the Suicide and Crisis Lifeline: 988 call the [place]  National Suicide Prevention Lifeline: 979-232-6143 or TTY: 5392387891 TTY 541-304-7218) to talk to a trained counselor call 1-800-273-TALK (toll free, 24 hour hotline) if you are experiencing a Mental Health or Behavioral Health Crisis or need someone to talk to.  Rosina Forte, BSN RN [facility], [facility] RN Care Manager Direct Dial: [phone]  Fax: [phone]

## 2024-04-11 ENCOUNTER — Ambulatory Visit: Admitting: Family Medicine

## 2024-04-12 ENCOUNTER — Ambulatory Visit: Admitting: Neurology

## 2024-04-25 ENCOUNTER — Encounter (HOSPITAL_BASED_OUTPATIENT_CLINIC_OR_DEPARTMENT_OTHER)

## 2024-04-25 ENCOUNTER — Encounter (HOSPITAL_COMMUNITY)

## 2024-04-25 ENCOUNTER — Other Ambulatory Visit (HOSPITAL_COMMUNITY)

## 2024-05-01 ENCOUNTER — Telehealth: Admitting: *Deleted

## 2024-05-18 ENCOUNTER — Encounter (HOSPITAL_BASED_OUTPATIENT_CLINIC_OR_DEPARTMENT_OTHER): Admitting: Family

## 2024-05-24 ENCOUNTER — Ambulatory Visit: Admitting: Nurse Practitioner

## 2025-01-08 ENCOUNTER — Ambulatory Visit
